# Patient Record
Sex: Female | Born: 1980 | Race: Black or African American | Hispanic: No | Smoking: Former smoker
Health system: Southern US, Community
[De-identification: ages and names within clinical notes are randomized; demographics above are authoritative.]

## PROBLEM LIST (undated history)

## (undated) DIAGNOSIS — R519 Headache, unspecified: Secondary | ICD-10-CM

## (undated) DIAGNOSIS — R51 Headache: Secondary | ICD-10-CM

## (undated) DIAGNOSIS — E119 Type 2 diabetes mellitus without complications: Secondary | ICD-10-CM

## (undated) DIAGNOSIS — R059 Cough, unspecified: Secondary | ICD-10-CM

## (undated) DIAGNOSIS — I10 Essential (primary) hypertension: Secondary | ICD-10-CM

## (undated) DIAGNOSIS — K219 Gastro-esophageal reflux disease without esophagitis: Secondary | ICD-10-CM

## (undated) DIAGNOSIS — F419 Anxiety disorder, unspecified: Secondary | ICD-10-CM

## (undated) DIAGNOSIS — J309 Allergic rhinitis, unspecified: Secondary | ICD-10-CM

## (undated) DIAGNOSIS — R05 Cough: Secondary | ICD-10-CM

## (undated) DIAGNOSIS — R109 Unspecified abdominal pain: Secondary | ICD-10-CM

## (undated) HISTORY — DX: Type 2 diabetes mellitus without complications: E11.9

## (undated) HISTORY — DX: Unspecified abdominal pain: R10.9

## (undated) HISTORY — DX: Cough, unspecified: R05.9

## (undated) HISTORY — DX: Anxiety disorder, unspecified: F41.9

## (undated) HISTORY — DX: Allergic rhinitis, unspecified: J30.9

## (undated) HISTORY — DX: Cough: R05

---

## 2009-03-20 DIAGNOSIS — R5383 Other fatigue: Secondary | ICD-10-CM | POA: Insufficient documentation

## 2009-03-20 DIAGNOSIS — R5381 Other malaise: Secondary | ICD-10-CM | POA: Insufficient documentation

## 2009-03-20 DIAGNOSIS — E669 Obesity, unspecified: Secondary | ICD-10-CM | POA: Insufficient documentation

## 2009-05-14 DIAGNOSIS — F0630 Mood disorder due to known physiological condition, unspecified: Secondary | ICD-10-CM | POA: Insufficient documentation

## 2011-08-23 HISTORY — PX: CHOLECYSTECTOMY: SHX55

## 2011-09-08 DIAGNOSIS — K8020 Calculus of gallbladder without cholecystitis without obstruction: Secondary | ICD-10-CM | POA: Insufficient documentation

## 2011-09-08 DIAGNOSIS — R252 Cramp and spasm: Secondary | ICD-10-CM | POA: Insufficient documentation

## 2011-09-09 DIAGNOSIS — R1011 Right upper quadrant pain: Secondary | ICD-10-CM | POA: Insufficient documentation

## 2011-09-09 DIAGNOSIS — E559 Vitamin D deficiency, unspecified: Secondary | ICD-10-CM | POA: Insufficient documentation

## 2011-09-12 DIAGNOSIS — N83202 Unspecified ovarian cyst, left side: Secondary | ICD-10-CM | POA: Insufficient documentation

## 2011-09-12 DIAGNOSIS — D259 Leiomyoma of uterus, unspecified: Secondary | ICD-10-CM | POA: Insufficient documentation

## 2011-10-11 DIAGNOSIS — F32A Depression, unspecified: Secondary | ICD-10-CM | POA: Insufficient documentation

## 2011-12-14 ENCOUNTER — Emergency Department (HOSPITAL_COMMUNITY)
Admission: EM | Admit: 2011-12-14 | Discharge: 2011-12-14 | Disposition: A | Discharge status: Home / Self Care | Payer: 59 | Attending: Emergency Medicine | Admitting: Emergency Medicine

## 2011-12-14 ENCOUNTER — Encounter (HOSPITAL_COMMUNITY): Payer: Self-pay

## 2011-12-14 ENCOUNTER — Emergency Department (HOSPITAL_COMMUNITY): Payer: 59

## 2011-12-14 DIAGNOSIS — R109 Unspecified abdominal pain: Secondary | ICD-10-CM | POA: Insufficient documentation

## 2011-12-14 DIAGNOSIS — R110 Nausea: Secondary | ICD-10-CM | POA: Insufficient documentation

## 2011-12-14 DIAGNOSIS — K8020 Calculus of gallbladder without cholecystitis without obstruction: Secondary | ICD-10-CM | POA: Insufficient documentation

## 2011-12-14 LAB — COMPREHENSIVE METABOLIC PANEL
ALT: 16 U/L (ref 0–35)
AST: 21 U/L (ref 0–37)
Albumin: 3.2 g/dL — ABNORMAL LOW (ref 3.5–5.2)
Alkaline Phosphatase: 52 U/L (ref 39–117)
CO2: 21 mEq/L (ref 19–32)
Chloride: 106 mEq/L (ref 96–112)
GFR calc non Af Amer: >90 mL/min (ref >90)
Potassium: 4 mEq/L (ref 3.5–5.1)
Sodium: 138 mEq/L (ref 135–145)
Total Bilirubin: 0.2 mg/dL — ABNORMAL LOW (ref 0.3–1.2)

## 2011-12-14 LAB — POCT PREGNANCY, URINE: Preg Test, Ur: NEGATIVE

## 2011-12-14 LAB — URINALYSIS, ROUTINE W REFLEX MICROSCOPIC
Glucose, UA: NEGATIVE mg/dL
Ketones, ur: NEGATIVE mg/dL
Leukocytes, UA: NEGATIVE
Protein, ur: NEGATIVE mg/dL
pH: 6.5 (ref 5.0–8.0)

## 2011-12-14 LAB — CBC
HCT: 36.9 % (ref 36.0–46.0)
MCHC: 34.4 g/dL (ref 30.0–36.0)
Platelets: 337 10*3/uL (ref 150–400)
RDW: 13 % (ref 11.5–15.5)
WBC: 9.2 10*3/uL (ref 4.0–10.5)

## 2011-12-14 LAB — URINE MICROSCOPIC-ADD ON

## 2011-12-14 LAB — DIFFERENTIAL
Basophils Absolute: 0 10*3/uL (ref 0.0–0.1)
Basophils Relative: 0 % (ref 0–1)
Lymphocytes Relative: 20 % (ref 12–46)
Monocytes Absolute: 0.4 10*3/uL (ref 0.1–1.0)
Neutro Abs: 6.8 10*3/uL (ref 1.7–7.7)
Neutrophils Relative %: 74 % (ref 43–77)

## 2011-12-14 MED ORDER — OXYCODONE-ACETAMINOPHEN 5-325 MG PO TABS: 1.0000 | ORAL_TABLET | Freq: Four times a day (QID) | ORAL | Status: AC | PRN

## 2011-12-14 MED ORDER — OXYCODONE-ACETAMINOPHEN 5-325 MG PO TABS
1.0000 | ORAL_TABLET | Freq: Once | ORAL | Status: AC
  Administered 2011-12-14: 1 via ORAL
  Filled 2011-12-14: qty 1

## 2011-12-14 MED ORDER — KETOROLAC TROMETHAMINE 60 MG/2ML IM SOLN
60.0000 mg | Freq: Once | INTRAMUSCULAR | Status: AC
  Administered 2011-12-14: 60 mg via INTRAMUSCULAR
  Filled 2011-12-14: qty 2

## 2011-12-14 NOTE — ED Notes (Signed)
Per EMS:  Pt reports RUQ abd pain. Pt was diagnosed with gallstones in Oct BP 147/110 P: 118 R: 20 SPO2: 98

## 2011-12-14 NOTE — ED Provider Notes (Signed)
History     CSN: [id number]  Arrival date & time 12/14/11  1478   First MD Initiated Contact with Patient 12/14/11 1010      Chief Complaint  Patient presents with  . Abdominal Pain    (Consider location/radiation/quality/duration/timing/severity/associated sxs/prior treatment) HPI Comments: Patient with a significant medical history presents emergency department with a chief complaint of abdominal pain.  Onset of symptoms began about 2 days ago, is described as intermittent and gradually worsening becoming severe today.  Location of pain is in the right upper quadrant and epigastric area described as sharp and knifelike stabbing.  Pain radiates to the right scapula and lasts about a minute and 30 seconds long.  Severity at its worst is 10 out of 10 and currently the patient states that 7/10  Pain is occasionally associated with food. Associated symptoms include nausea but the patient denies any vomiting, diarrhea, shortness of breath, dyspnea on exertion, chest pain, cough, hemoptysis, recent travel or claudication.  Note the patient is in everyday smoker and is currently taking birth control. Pt states she was dx with gallstones in the October.   Patient is a 31 y.o. female presenting with abdominal pain. The history is provided by the patient.  Abdominal Pain The primary symptoms of the illness include abdominal pain and nausea. The primary symptoms of the illness do not include vomiting or diarrhea.  Symptoms associated with the illness do not include constipation.    No past medical history on file.  No past surgical history on file.  No family history on file.  History  Substance Use Topics  . Smoking status: Not on file  . Smokeless tobacco: Not on file  . Alcohol Use: Not on file    OB History    No data available      Review of Systems  Gastrointestinal: Positive for nausea and abdominal pain. Negative for vomiting, diarrhea, constipation, blood in stool, abdominal  distention, anal bleeding and rectal pain.  All other systems reviewed and are negative.    Allergies  Review of patient's allergies indicates no known allergies.  Home Medications   Current Outpatient Rx  Name Route Sig Dispense Refill  . ACETAMINOPHEN-CODEINE #3 300-30 MG PO TABS Oral Take 1-2 tablets by mouth daily as needed. For pain.    . ASPIRIN-ACETAMINOPHEN-CAFFEINE 250-250-65 MG PO TABS Oral Take 2 tablets by mouth daily as needed. For headache.    . CETIRIZINE HCL 10 MG PO TABS Oral Take 10 mg by mouth daily as needed. For allergies.    Marland Kitchen CITALOPRAM HYDROBROMIDE 10 MG PO TABS Oral Take 10 mg by mouth daily.    . CYCLOBENZAPRINE HCL 10 MG PO TABS Oral Take 10 mg by mouth every 8 (eight) hours.    Carma Leaven M PLUS PO TABS Oral Take 1 tablet by mouth daily.    Azzie Roup ACE-ETH ESTRAD-FE 1.5-30 MG-MCG PO TABS Oral Take 1 tablet by mouth daily.      BP 125/84  Pulse 86  Temp 98.7 F (37.1 C)  Resp 20  SpO2 100%  Physical Exam  Nursing note and vitals reviewed. Constitutional: Vital signs are normal. She appears well-developed and well-nourished. No distress.  HENT:  Head: Normocephalic and atraumatic.  Mouth/Throat: Uvula is midline, oropharynx is clear and moist and mucous membranes are normal.  Eyes: Conjunctivae and EOM are normal. Pupils are equal, round, and reactive to light.  Neck: Normal range of motion and full passive range of motion without pain. Neck  supple. No spinous process tenderness and no muscular tenderness present. No rigidity. No Brudzinski's sign noted.  Cardiovascular: Normal rate and regular rhythm.   Pulmonary/Chest: Effort normal and breath sounds normal. No accessory muscle usage. Not tachypneic. No respiratory distress.  Abdominal: Soft. Normal appearance. She exhibits no distension, no ascites, no pulsatile midline mass and no mass. There is tenderness. There is no CVA tenderness. No hernia.    Lymphadenopathy:    She has no cervical  adenopathy.  Neurological: She is alert.  Skin: Skin is warm and dry. No rash noted. She is not diaphoretic.  Psychiatric: She has a normal mood and affect. Her speech is normal and behavior is normal.    ED Course  Procedures (including critical care time)  Labs Reviewed  COMPREHENSIVE METABOLIC PANEL - Abnormal; Notable for the following:    Albumin 3.2 (*)    Total Bilirubin 0.2 (*)    All other components within normal limits  URINALYSIS, ROUTINE W REFLEX MICROSCOPIC - Abnormal; Notable for the following:    Hgb urine dipstick TRACE (*)    All other components within normal limits  URINE MICROSCOPIC-ADD ON - Abnormal; Notable for the following:    Squamous Epithelial / LPF MANY (*)    All other components within normal limits  CBC  DIFFERENTIAL  LIPASE, BLOOD  POCT PREGNANCY, URINE   US Abdomen Complete  12/14/2011  *RADIOLOGY REPORT*  Clinical Data: Right upper quadrant pain.  ABDOMEN ULTRASOUND  Technique:  Complete abdominal ultrasound examination was performed including evaluation of the liver, gallbladder, bile ducts, pancreas, kidneys, spleen, IVC, and abdominal aorta.  Comparison: No comparison studies available.  Findings:  Gallbladder:  Multiple stones are seen in the gallbladder lumen. Stones measure up to about 2 cm in diameter.  There is no gallbladder wall thickening or pericholecystic fluid.  The sonographer reports no sonographic Murphy's sign.  Common Bile Duct:  Nondilated at 4 mm diameter.  Liver:  Coarsening of the echotexture suggest fatty infiltration. No intrahepatic biliary duct dilatation.  No evidence for intraparenchymal mass lesion.  IVC:  Normal.  Pancreas:  Normal.  Spleen:  Normal.  Right kidney:  11.8 cm in long axis.  Normal.  Left kidney:  11.0 cm in long axis.  Normal.  Abdominal Aorta:  No aneurysm.  IMPRESSION: Cholelithiasis without gallbladder wall thickening, ductal dilatation, or pericholecystic fluid.  Original Report Authenticated By: ERIC A.  MANSELL, M.D.     No diagnosis found.    MDM  Cholelithiasis  Patient's pain is in his in emergency department.  She has been hemodynamically stable and in no acute distress throughout the entire hospital stay.  Findings of the ultrasound indicated the patient has cholecystitis this without any gallbladder thickening, duct dilatation, or pericholecystic fluid.  Labs have been reviewed and lipase and WBC are within normal limits.  Patient has been afebrile with vital signs stable.  Patient has been instructed to followup with Gen. surgery for further workup.  Patient will be discharged with pain medication.        Jaci Carrel, [place] 12/14/11 1210

## 2011-12-14 NOTE — Discharge Instructions (Signed)
Cholelithiasis Cholelithiasis (also called gallstones) is a form of gallbladder disease where gallstones form in your gallbladder. The gallbladder is a non-essential organ that stores bile made in the liver, which helps digest fats. Gallstones begin as small crystals and slowly grow into stones. Gallstone pain occurs when the gallbladder spasms, and a gallstone is blocking the duct. Pain can also occur when a stone passes out of the duct.  Women are more likely to develop gallstones than men. Other factors that increase the risk of gallbladder disease are:  Having multiple pregnancies. Physicians sometimes advise removing diseased gallbladders before future pregnancies.   Obesity.   Diets heavy in fried foods and fat.   Increasing age (older than 60).   Prolonged use of medications containing female hormones.   Diabetes mellitus.   Rapid weight loss.   Family history of gallstones (heredity).  SYMPTOMS  Feeling sick to your stomach (nauseous).   Abdominal pain.   Yellowing of the skin (jaundice).   Sudden pain. It may persist from several minutes to several hours.   Worsening pain with deep breathing or when jarred.   Fever.   Tenderness to the touch.  In some cases, when gallstones do not move into the bile duct, people have no pain or symptoms. These are called "silent" gallstones. TREATMENT In severe cases, emergency surgery may be required. HOME CARE INSTRUCTIONS   Only take over-the-counter or prescription medicines for pain, discomfort, or fever as directed by your caregiver.   Follow a low-fat diet until seen again. Fat causes the gallbladder to contract, which can result in pain.   Follow up as instructed. Attacks are almost always recurrent and surgery is usually required for permanent treatment.  SEEK IMMEDIATE MEDICAL CARE IF:   Your pain increases and is not controlled by medications.   You have an oral temperature above 102 F (38.9 C), not controlled by  medication.   You develop nausea and vomiting.  MAKE SURE YOU:   Understand these instructions.   Will watch your condition.   Will get help right away if you are not doing well or get worse.  Document Released: 08/04/2005 Document Revised: 07/28/2011 Document Reviewed: 10/07/2010 [employer] Patient Information 2012 [employer]. 

## 2011-12-17 NOTE — ED Provider Notes (Signed)
Medical screening examination/treatment/procedure(s) were performed by non-physician practitioner and as supervising physician I was immediately available for consultation/collaboration.  Carmelita Amparo R. Iasia Forcier, MD 12/17/11 0737 

## 2012-01-05 ENCOUNTER — Ambulatory Visit (INDEPENDENT_AMBULATORY_CARE_PROVIDER_SITE_OTHER): Payer: 59 | Admitting: Surgery

## 2013-04-03 ENCOUNTER — Ambulatory Visit (INDEPENDENT_AMBULATORY_CARE_PROVIDER_SITE_OTHER): Payer: 59 | Admitting: Internal Medicine

## 2013-04-03 ENCOUNTER — Encounter: Payer: Self-pay | Admitting: Internal Medicine

## 2013-04-03 VITALS — BP 136/88 | HR 82 | Temp 98.4°F | Resp 14 | Ht 65.5 in | Wt 232.0 lb

## 2013-04-03 DIAGNOSIS — F329 Major depressive disorder, single episode, unspecified: Secondary | ICD-10-CM

## 2013-04-03 DIAGNOSIS — G43909 Migraine, unspecified, not intractable, without status migrainosus: Secondary | ICD-10-CM | POA: Insufficient documentation

## 2013-04-03 DIAGNOSIS — G4700 Insomnia, unspecified: Secondary | ICD-10-CM | POA: Insufficient documentation

## 2013-04-03 DIAGNOSIS — F32A Depression, unspecified: Secondary | ICD-10-CM | POA: Insufficient documentation

## 2013-04-03 DIAGNOSIS — J309 Allergic rhinitis, unspecified: Secondary | ICD-10-CM

## 2013-04-03 MED ORDER — VILAZODONE HCL 10 & 20 & 40 MG PO KIT: 1.0000 | PACK | Freq: Every day | ORAL | Status: DC

## 2013-04-03 NOTE — Progress Notes (Signed)
Patient ID: Kendra Ruiz, female   DOB: [birth year], 32 y.o.   MRN: [id number]  Chief Complaint  Patient presents with  . NP to Establish    No Known Allergies  HPI 32 y/o female patient is here to establish care. She feels low and that is the main reason she made this appointment. She is under lot of stress with her husband and his legal issues. She is worried about him getting into trouble and then she will be left on her own to take care of herself and their son. She has been thinking about this all time. This is preventing her from focusing on her work. She is not able to sleep at night. She gets tearful easily and is not able to control her emotions. She finds herself being mean to her staff at work. She wants to hold things together but feels it is all falling apart  Review of Systems  Constitutional: Negative for fever, chills, malaise/fatigue and diaphoresis.  HENT: Negative for sore throat and neck pain.   Eyes: Negative for blurred vision and double vision.  Respiratory: Negative for cough, hemoptysis and shortness of breath.   Cardiovascular: Negative for chest pain, palpitations, orthopnea, claudication, leg swelling and PND.  Gastrointestinal: Negative for heartburn, nausea, vomiting, abdominal pain, diarrhea and constipation.  Genitourinary: Negative for dysuria.  Musculoskeletal: Negative for myalgias and falls.  Skin: Negative for itching and rash.  Neurological: Negative for dizziness, tingling, focal weakness, weakness and headaches.  Psychiatric/Behavioral: Positive for depression. Negative for suicidal ideas, hallucinations, memory loss and substance abuse. The patient is nervous/anxious and has insomnia.    Past Medical History  Diagnosis Date  . Allergic rhinitis   . Abdominal cramping   . Anxiety   . Cough    Past Surgical History  Procedure Laterality Date  . Cholecystectomy  2013   Family History  Problem Relation Age of Onset  . Diabetes Sister   .  Hypertension Sister   . Bipolar disorder Sister   . Bipolar disorder Sister   . Obesity Sister    History   Social History  . Marital Status: Married    Spouse Name: N/A    Number of Children: N/A  . Years of Education: N/A   Occupational History  . Not on file.   Social History Main Topics  . Smoking status: Current Every Day Smoker -- 0.50 packs/day for 10 years    Types: Cigarettes  . Smokeless tobacco: Not on file  . Alcohol Use: Yes  . Drug Use: No  . Sexual Activity: Not on file   Other Topics Concern  . Not on file   Social History Narrative  . No narrative on file   BP 136/88  Pulse 82  Temp(Src) 98.4 F (36.9 C) (Oral)  Resp 14  Ht 5' 5.5" (1.664 m)  Wt 232 lb (105.235 kg)  BMI 38.01 kg/m2  LMP 04/03/2013  Physical Exam  Constitutional: She is oriented to person, place, and time. She appears well-developed and well-nourished. No distress.  HENT:  Head: Normocephalic and atraumatic.  Mouth/Throat: Oropharynx is clear and moist. No oropharyngeal exudate.  Eyes: Conjunctivae are normal. Pupils are equal, round, and reactive to light.  Neck: Normal range of motion. Neck supple. No JVD present. No thyromegaly present.  Cardiovascular: Normal rate and regular rhythm.   Pulmonary/Chest: Effort normal and breath sounds normal. No respiratory distress. She has no wheezes. She has no rales.  Abdominal: Soft. Bowel sounds are normal. She exhibits  no mass.  Musculoskeletal: Normal range of motion. She exhibits no edema and no tenderness.  Lymphadenopathy:    She has no cervical adenopathy.  Neurological: She is alert and oriented to person, place, and time. No cranial nerve deficit.  Skin: Skin is warm and dry. She is not diaphoretic.  Psychiatric: Judgment and thought content normal.  tearful   No labs for review  Assessment/plan  Depression- will have her on viibryd- will give titration kit of 10, 20 and 40 mg daily and see her in 4 weeks to assess the  effect of medication. Recommended psychologist visit for couselling  Insomnia- sleep hygiene reviewed with patient. Will continue restoril for now  Migraine- stable with excedrin  Allergic rhinitis- continue zyrtec for now

## 2013-05-01 ENCOUNTER — Ambulatory Visit: Payer: Self-pay | Admitting: Internal Medicine

## 2013-05-14 ENCOUNTER — Ambulatory Visit (INDEPENDENT_AMBULATORY_CARE_PROVIDER_SITE_OTHER): Payer: 59 | Admitting: Internal Medicine

## 2013-05-14 ENCOUNTER — Encounter: Payer: Self-pay | Admitting: Internal Medicine

## 2013-05-14 VITALS — BP 118/88 | HR 94 | Temp 98.6°F | Resp 18 | Ht 65.5 in | Wt 240.8 lb

## 2013-05-14 DIAGNOSIS — R7309 Other abnormal glucose: Secondary | ICD-10-CM

## 2013-05-14 DIAGNOSIS — F329 Major depressive disorder, single episode, unspecified: Secondary | ICD-10-CM

## 2013-05-14 DIAGNOSIS — M549 Dorsalgia, unspecified: Secondary | ICD-10-CM | POA: Insufficient documentation

## 2013-05-14 DIAGNOSIS — Z0000 Encounter for general adult medical examination without abnormal findings: Secondary | ICD-10-CM

## 2013-05-14 DIAGNOSIS — J309 Allergic rhinitis, unspecified: Secondary | ICD-10-CM

## 2013-05-14 DIAGNOSIS — G4700 Insomnia, unspecified: Secondary | ICD-10-CM

## 2013-05-14 DIAGNOSIS — R739 Hyperglycemia, unspecified: Secondary | ICD-10-CM | POA: Insufficient documentation

## 2013-05-14 DIAGNOSIS — Z30430 Encounter for insertion of intrauterine contraceptive device: Secondary | ICD-10-CM | POA: Insufficient documentation

## 2013-05-14 MED ORDER — NAPROXEN 500 MG PO TABS: 500.0000 mg | ORAL_TABLET | Freq: Three times a day (TID) | ORAL | Status: DC

## 2013-05-14 MED ORDER — ESCITALOPRAM OXALATE 10 MG PO TABS: 10.0000 mg | ORAL_TABLET | Freq: Every day | ORAL | Status: DC

## 2013-05-14 NOTE — Patient Instructions (Signed)
As discussed, keep track of your calorie count on My Fitness Pal and exercise 4 days a week.   Do not discontinue your medications by yourself without consulting the office  If your back pain does not imporve with current medication, please notify us

## 2013-05-14 NOTE — Progress Notes (Signed)
Patient ID: Kendra Ruiz, female   DOB: 1981/02/27, 32 y.o.   MRN: [id number]  Chief Complaint  Patient presents with  . Annual Exam    depression, insomnia   No Known Allergies  HPI 32 y/o female patient is here for her annual visit. She sees her Gyn for her routine pelvic and breast exam and has appointment on 05/16/13. She was started on viibryd last visit for her depression but noted herself to be "wired' from this and thus stopped taking it a week and a half back. This was also making sleepy and tired. Her husband is listening to her and thus she feels less stressful than before. She feels she is holding the situation better than before but still feels in a lose all situation She has tired celexa for a year before and was helping her. She will be getting her influenza vaccine at work She has been having heartburn for few months. She had cholecystectomy last year. While eating heavy meals, she has discomfort in her abdomen She eats out  and junk food. She had bojangles for breakfast, [employer] burger for lunch and burger king sandwich for dinner yesterday. Does not exercise She was pushing med cart at work few days back and developed soreness on right mid back area. This has resolved some but now has noticed pain on left mid back area, occurs intermittently, as sharp pain with some radiation down her leg and resolves by itself. She laso has soreness on her muscles on that side.   Review of Systems  Constitutional: Negative for fever, chills, malaise/fatigue and diaphoresis.  HENT: Negative for sore throat and neck pain.   Eyes: Negative for blurred vision and double vision.  Respiratory: Negative for cough, hemoptysis and shortness of breath.   Cardiovascular: Negative for chest pain, palpitations, orthopnea, claudication, leg swelling and PND.  Gastrointestinal: Negative for nausea, vomiting, abdominal pain, diarrhea and constipation.  Genitourinary: Negative for dysuria,  heamturia Musculoskeletal: Negative for myalgias and falls.  Skin: Negative for itching and rash.  Neurological: Negative for dizziness, tingling, focal weakness, weakness and headaches.  Psychiatric/Behavioral: Positive for depression. Negative for suicidal ideas, hallucinations, memory loss and substance abuse. The patient is nervous/anxious and has insomnia. she has been taking restoril and that is helpful  Past Medical History  Diagnosis Date  . Allergic rhinitis   . Abdominal cramping   . Anxiety   . Cough    Past Surgical History  Procedure Laterality Date  . Cholecystectomy  2013   Family History  Problem Relation Age of Onset  . Diabetes Sister   . Hypertension Sister   . Bipolar disorder Sister   . Bipolar disorder Sister   . Obesity Sister    History   Social History  . Marital Status: Married    Spouse Name: N/A    Number of Children: N/A  . Years of Education: N/A   Occupational History  . Not on file.   Social History Main Topics  . Smoking status: Current Every Day Smoker -- 0.50 packs/day for 10 years    Types: Cigarettes  . Smokeless tobacco: Not on file  . Alcohol Use: Yes  . Drug Use: No  . Sexual Activity: Not on file   Other Topics Concern  . Not on file   Social History Narrative  . No narrative on file    Current Outpatient Prescriptions on File Prior to Visit  Medication Sig Dispense Refill  . aspirin-acetaminophen-caffeine (EXCEDRIN MIGRAINE) 250-250-65 MG per tablet  Take 2 tablets by mouth daily as needed. For headache.      . cetirizine (ZYRTEC) 10 MG tablet Take 10 mg by mouth daily as needed. For allergies.      . Multiple Vitamins-Minerals (MULTIVITAMINS THER. W/MINERALS) TABS Take 1 tablet by mouth daily.      . norethindrone-ethinyl estradiol-iron (MICROGESTIN FE,GILDESS FE,LOESTRIN FE) 1.5-30 MG-MCG tablet Take 1 tablet by mouth daily.      . temazepam (RESTORIL) 7.5 MG capsule Take 7.5 mg by mouth at bedtime as needed for  sleep.       No current facility-administered medications on file prior to visit.   Physical exam  BP 118/88  Pulse 94  Temp(Src) 98.6 F (37 C) (Oral)  Resp 18  Ht 5' 5.5" (1.664 m)  Wt 240 lb 12.8 oz (109.226 kg)  BMI 39.45 kg/m2  SpO2 98%  Constitutional: She is oriented to person, place, and time. She appears well-developed and well-nourished. No distress.  HENT:   Head: Normocephalic and atraumatic.   Mouth/Throat: Oropharynx is clear and moist. No oropharyngeal exudate.  Eyes: Conjunctivae are normal. Pupils are equal, round, and reactive to light.  Neck: Normal range of motion. Neck supple. No JVD present. No thyromegaly present.  Cardiovascular: Normal rate and regular rhythm.   Pulmonary/Chest: Effort normal and breath sounds normal. No respiratory distress. She has no wheezes. She has no rales.  Abdominal: Soft. Bowel sounds are normal. She exhibits no mass.  Musculoskeletal: Normal range of motion. She exhibits no edema and no tenderness.  Lymphadenopathy:    She has no cervical adenopathy.  Neurological: She is alert and oriented to person, place, and time. No cranial nerve deficit.  Skin: Skin is warm and dry. She is not diaphoretic.  Psychiatric: Judgment and thought content normal.  Gets pelvic and breast exam at her Gyn   No labs for review  Assessment/plan  Depression- persists. Self stopped viibryd. Her lifestyle and current home situation are contributing to this mainly. Will have her on escitalopram 10 mg daily for 2 weeks and then increase to 20 mg daily and reassess her in 6 weeks. counselled about warning signs requiring ED visit. Encouraged not to stop medication by herself. Common side effects explained. Also encouraged to exercise and be careful with her diet.talked about different apps she can use on her phone to keep track of her calorie intake  Insomnia- sleep hygiene reviewed with patient. Will continue restoril for now  Allergic rhinitis-  continue zyrtec for now. Symptoms under control  Routine exam- will get her influenza vaccine at work. Has upcoming ob/gyn appointment. Exercise and dietary counselling provided.  Hyperglycemia- hx of hyperglycemia in past. No labs for review. Will check bmp and a1c to rule out diabetes  Back pain- appears musculoskeletal. Avoid heavy lifting/ pushing for now, back rest and take naprsyn 500 mg every 8 hours for 5 days. If no improvement, will consider further imaging study  Spent more than 25 minutes of this 50 minutes visit counselling her about lifestyle modification, losing weight

## 2013-05-15 LAB — COMPREHENSIVE METABOLIC PANEL
ALT: 26 IU/L (ref 0–32)
AST: 27 IU/L (ref 0–40)
Albumin/Globulin Ratio: 1.4 (ref 1.1–2.5)
Chloride: 101 mmol/L (ref 97–108)
GFR calc Af Amer: 139 mL/min/{1.73_m2} (ref >59)
GFR calc non Af Amer: 120 mL/min/{1.73_m2} (ref >59)
Glucose: 90 mg/dL (ref 65–99)
Potassium: 4.5 mmol/L (ref 3.5–5.2)
Sodium: 137 mmol/L (ref 134–144)
Total Bilirubin: 0.2 mg/dL (ref 0.0–1.2)
Total Protein: 6.5 g/dL (ref 6.0–8.5)

## 2013-05-15 LAB — CBC WITH DIFFERENTIAL/PLATELET
Basophils Absolute: 0 10*3/uL (ref 0.0–0.2)
Eos: 1 %
Immature Grans (Abs): 0 10*3/uL (ref 0.0–0.1)
Immature Granulocytes: 0 %
Lymphs: 24 %
MCHC: 32.9 g/dL (ref 31.5–35.7)
Monocytes: 6 %
Neutrophils Relative %: 69 %
RDW: 13.4 % (ref 12.3–15.4)
WBC: 9.8 10*3/uL (ref 3.4–10.8)

## 2013-05-15 LAB — LIPID PANEL
Cholesterol, Total: 147 mg/dL (ref 100–199)
LDL Calculated: 65 mg/dL (ref 0–99)
Triglycerides: 112 mg/dL (ref 0–149)

## 2013-05-15 LAB — HEMOGLOBIN A1C: Est. average glucose Bld gHb Est-mCnc: 137 mg/dL

## 2013-05-15 LAB — TSH: TSH: 1.62 u[IU]/mL (ref 0.450–4.500)

## 2013-07-24 ENCOUNTER — Ambulatory Visit: Payer: 59 | Admitting: Internal Medicine

## 2013-07-24 DIAGNOSIS — Z0289 Encounter for other administrative examinations: Secondary | ICD-10-CM

## 2013-09-24 ENCOUNTER — Emergency Department (INDEPENDENT_AMBULATORY_CARE_PROVIDER_SITE_OTHER)
Admission: EM | Admit: 2013-09-24 | Discharge: 2013-09-24 | Disposition: A | Discharge status: Home / Self Care | Payer: 59 | Source: Home / Self Care

## 2013-09-24 ENCOUNTER — Encounter (HOSPITAL_COMMUNITY): Payer: Self-pay | Admitting: Emergency Medicine

## 2013-09-24 DIAGNOSIS — J069 Acute upper respiratory infection, unspecified: Secondary | ICD-10-CM

## 2013-09-24 NOTE — ED Provider Notes (Signed)
CSN: [id number]     Arrival date & time 09/24/13  1301 History   First MD Initiated Contact with Patient 09/24/13 1400     Chief Complaint  Patient presents with  . URI   (Consider location/radiation/quality/duration/timing/severity/associated sxs/prior Treatment) HPI Comments: Complains of upper respiratory infection symptoms.  Patient is a [age] y.o. female presenting with URI.  URI Presenting symptoms: congestion, cough, fatigue and rhinorrhea   Presenting symptoms: no fever   Associated symptoms: sneezing   Associated symptoms: no neck pain and no wheezing     Past Medical History  Diagnosis Date  . Allergic rhinitis   . Abdominal cramping   . Anxiety   . Cough    Past Surgical History  Procedure Laterality Date  . Cholecystectomy  2013   Family History  Problem Relation Age of Onset  . Diabetes Sister   . Hypertension Sister   . Bipolar disorder Sister   . Bipolar disorder Sister   . Obesity Sister    History  Substance Use Topics  . Smoking status: Current Every Day Smoker -- 0.50 packs/day for 10 years    Types: Cigarettes  . Smokeless tobacco: Not on file  . Alcohol Use: Yes   OB History   Grav Para Term Preterm Abortions TAB SAB Ect Mult Living                 Review of Systems  Constitutional: Positive for activity change and fatigue. Negative for fever, chills and appetite change.  HENT: Positive for congestion, postnasal drip, rhinorrhea and sneezing. Negative for facial swelling.   Eyes: Negative.   Respiratory: Positive for cough. Negative for shortness of breath and wheezing.   Cardiovascular: Negative.   Genitourinary: Negative.   Musculoskeletal: Negative for neck pain and neck stiffness.  Skin: Negative for pallor and rash.  Neurological: Negative.     Allergies  Review of patient's allergies indicates no known allergies.  Home Medications   Current Outpatient Rx  Name  Route  Sig  Dispense  Refill  . aspirin-acetaminophen-caffeine  (EXCEDRIN MIGRAINE) 250-250-65 MG per tablet   Oral   Take 2 tablets by mouth daily as needed. For headache.         . cetirizine (ZYRTEC) 10 MG tablet   Oral   Take 10 mg by mouth daily as needed. For allergies.         Marland Kitchen. escitalopram (LEXAPRO) 10 MG tablet   Oral   Take 1 tablet (10 mg total) by mouth daily. Take 10 mg daily for 2 weeks and then increase it to 20 mg daily   60 tablet   3   . Multiple Vitamins-Minerals (MULTIVITAMINS THER. W/MINERALS) TABS   Oral   Take 1 tablet by mouth daily.         . naproxen (NAPROSYN) 500 MG tablet   Oral   Take 1 tablet (500 mg total) by mouth 3 (three) times daily with meals.   30 tablet   0   . norethindrone-ethinyl estradiol-iron (MICROGESTIN FE,GILDESS FE,LOESTRIN FE) 1.5-30 MG-MCG tablet   Oral   Take 1 tablet by mouth daily.         . temazepam (RESTORIL) 7.5 MG capsule   Oral   Take 7.5 mg by mouth at bedtime as needed for sleep.          BP 118/91  Pulse 82  Temp(Src) 98.4 F (36.9 C) (Oral)  Resp 20  SpO2 100%  LMP 09/04/2013 Physical Exam  Nursing  note and vitals reviewed. Constitutional: She is oriented to person, place, and time. She appears well-developed and well-nourished. No distress.  HENT:  Mouth/Throat: No oropharyngeal exudate.  Bilateral TMs are normal Oropharynx is normal with the exception of clear PND.  Eyes: Conjunctivae and EOM are normal.  Neck: Normal range of motion. Neck supple.  Cardiovascular: Normal rate and regular rhythm.   Pulmonary/Chest: Effort normal and breath sounds normal. No respiratory distress. She has no wheezes. She has no rales.  Musculoskeletal: Normal range of motion. She exhibits no edema.  Lymphadenopathy:    She has no cervical adenopathy.  Neurological: She is alert and oriented to person, place, and time.  Skin: Skin is warm and dry. No rash noted.  Psychiatric: She has a normal mood and affect.    ED Course  Procedures (including critical care  time) Labs Review Labs Reviewed - No data to display Imaging Review No results found.    MDM   1. URI (upper respiratory infection)      Alka-Seltzer cold plus nighttime Robitussin-DM Rest and drink plenty of fluids    [name], NP 09/24/13 1444

## 2013-09-24 NOTE — Discharge Instructions (Signed)

## 2013-09-24 NOTE — ED Notes (Signed)
C/o  Productive cough with tan/grey sputum.  Sob.  Chest soreness.  Fever yesterday.    Pt has tried otc meds with no relief.  Denies n/v/d

## 2013-09-27 NOTE — ED Provider Notes (Signed)
Medical screening examination/treatment/procedure(s) were performed by resident physician or non-physician practitioner and as supervising physician I was immediately available for consultation/collaboration.   Jobanny Mavis DOUGLAS MD.   Emelda Kohlbeck D Dravon Nott, MD 09/27/13 1100 

## 2013-11-05 ENCOUNTER — Ambulatory Visit: Payer: 59 | Admitting: Internal Medicine

## 2013-11-05 DIAGNOSIS — Z0289 Encounter for other administrative examinations: Secondary | ICD-10-CM

## 2013-11-19 IMAGING — US US ABDOMEN COMPLETE
1 series · 14 of 25 positions shown · non-contrast
Comparison: No comparison studies available.

CLINICAL DATA: Right upper quadrant pain.

ABDOMEN ULTRASOUND
TECHNIQUE: Complete abdominal ultrasound examination was performed
including evaluation of the liver, gallbladder, bile ducts,
pancreas, kidneys, spleen, IVC, and abdominal aorta.

[Series 1: us abdomen complete · 0.30mm/px · 14 of 75 slices shown]
[im 1/75]
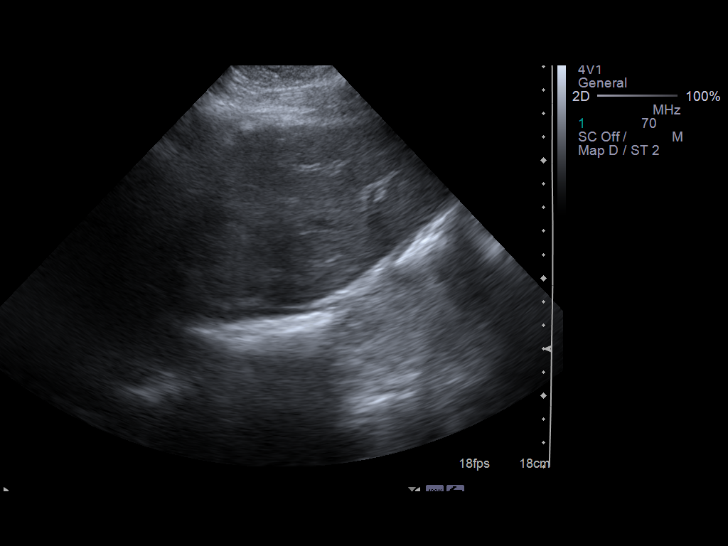
[im 7/75]
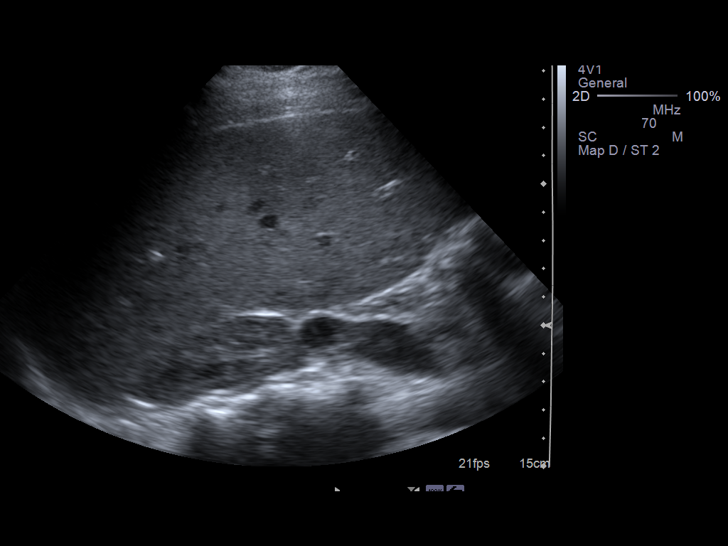
[im 13/75]
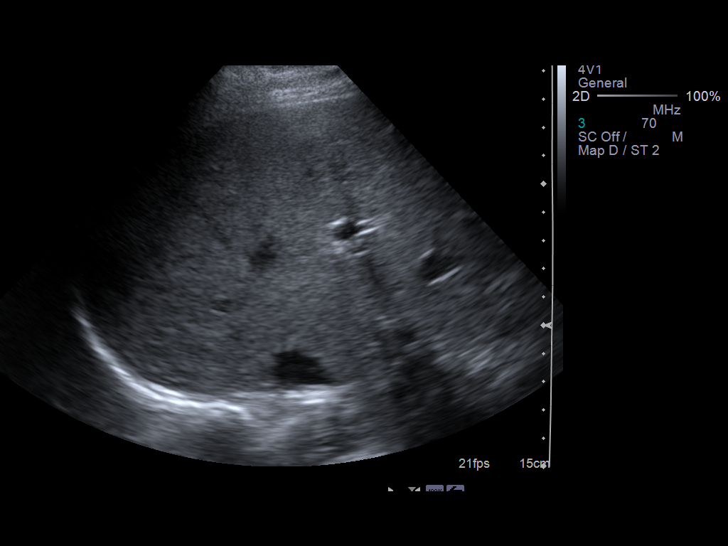
[im 19/75]
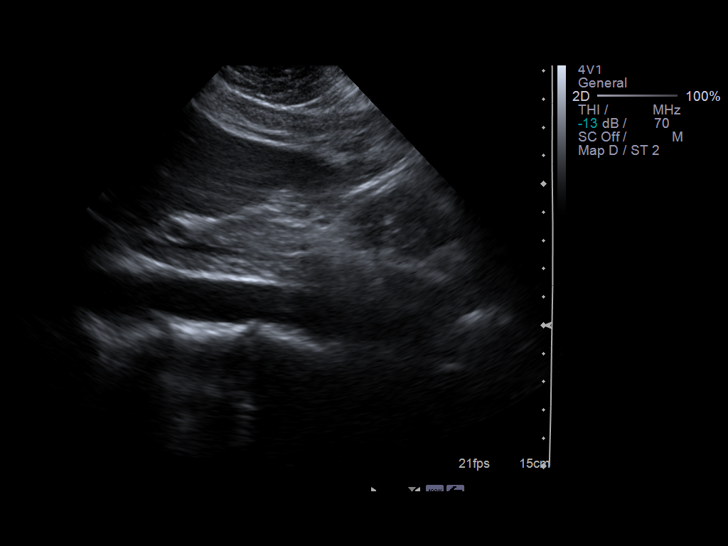
[im 25/75]
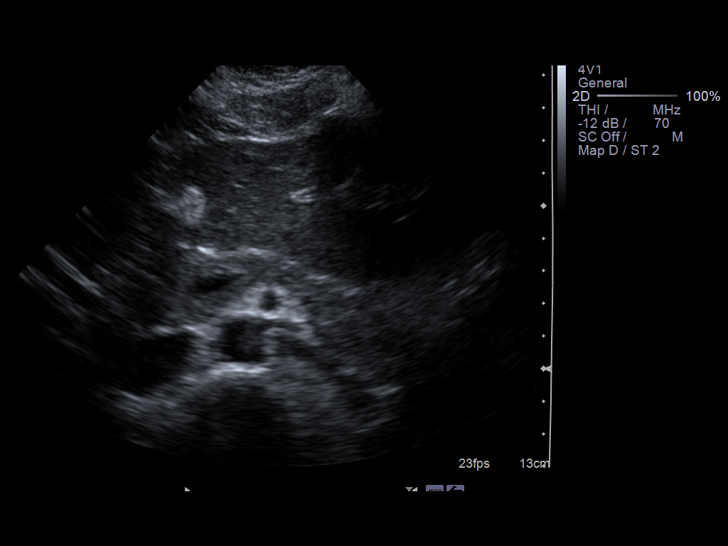
[im 28/75]
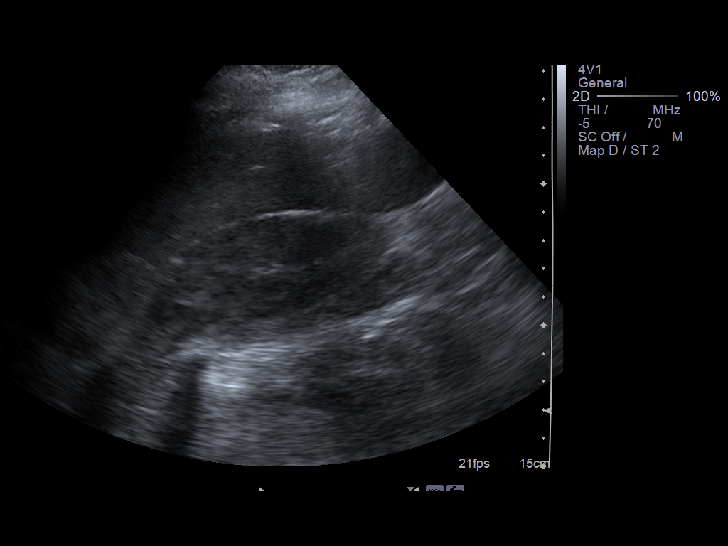
[im 34/75]
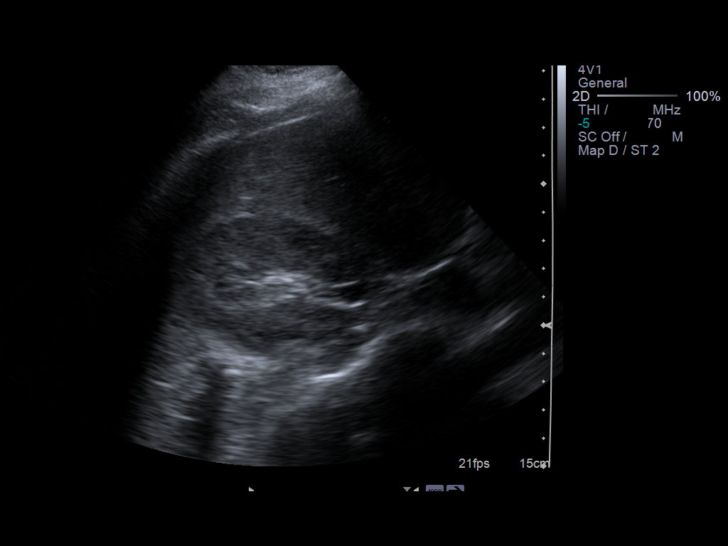
[im 41/75]
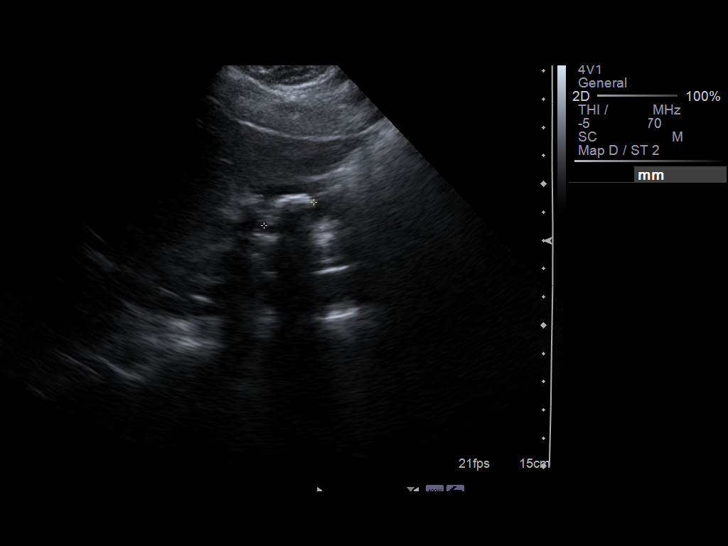
[im 47/75]
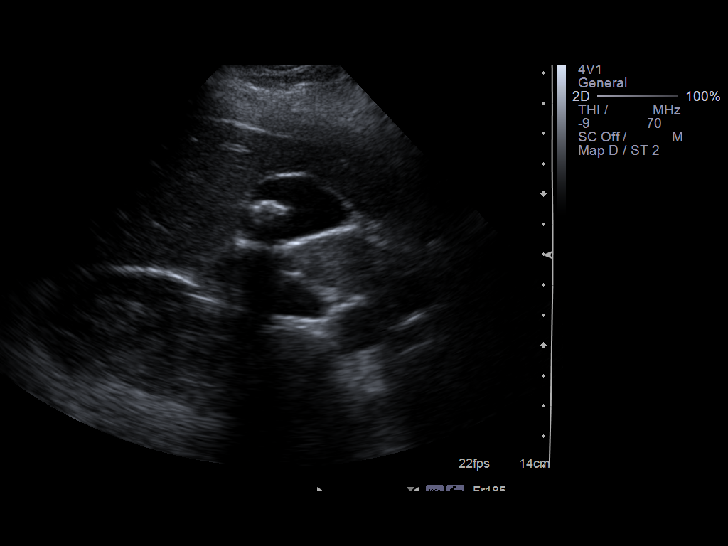
[im 50/75]
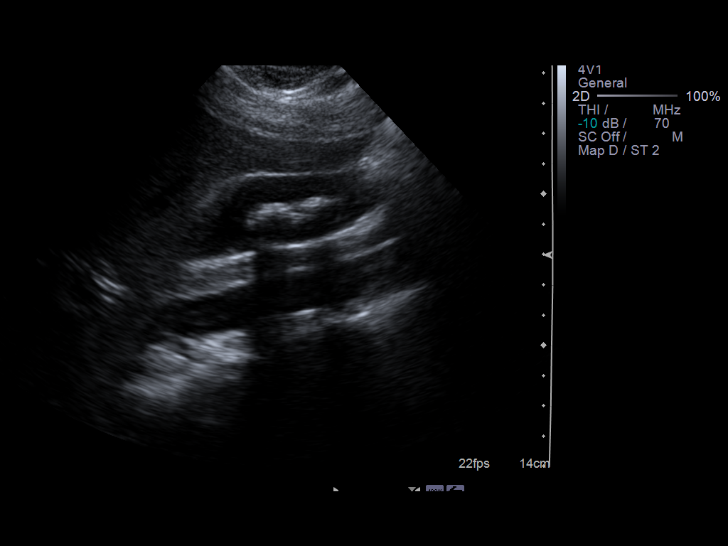
[im 56/75]
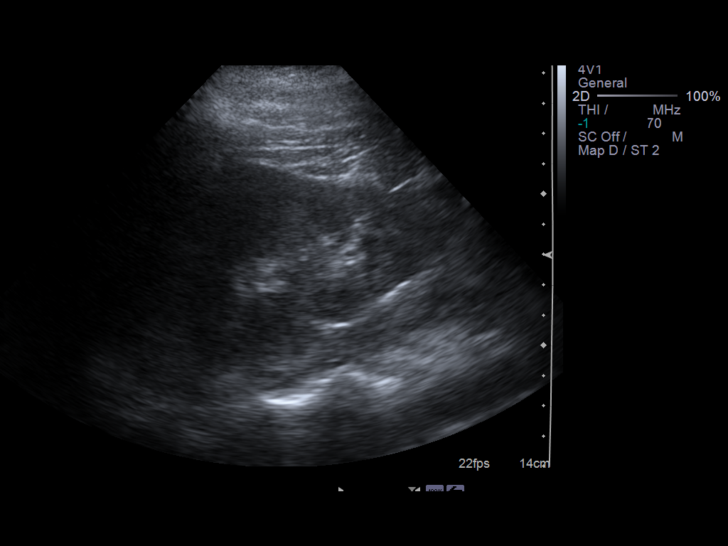
[im 62/75]
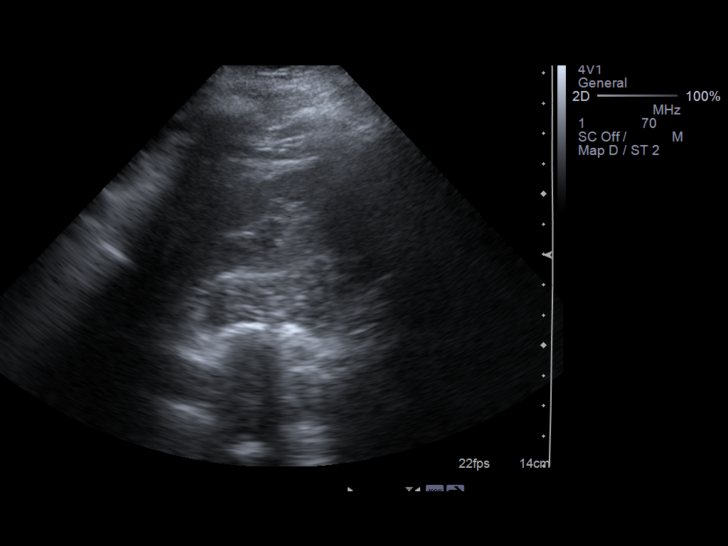
[im 68/75]
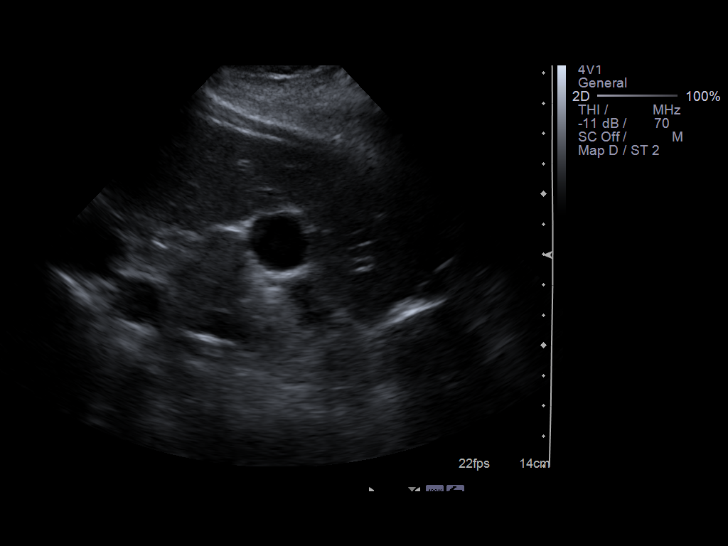
[im 75/75]
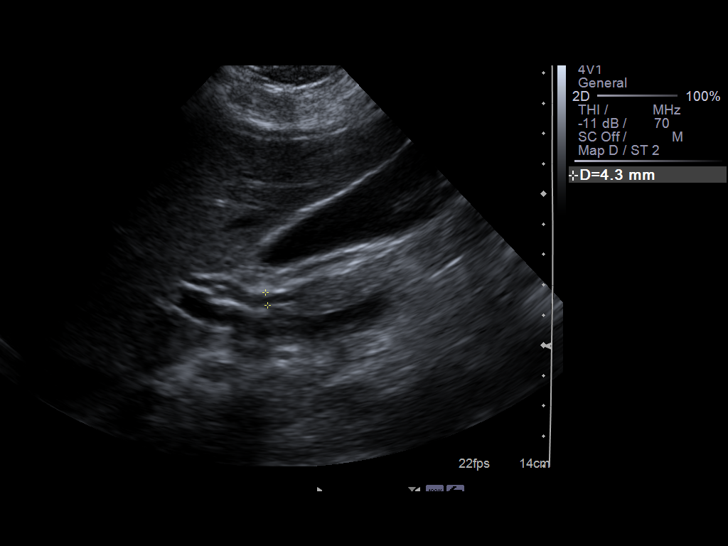

[14 of 25 positions shown; findings below may reference images not displayed]

FINDINGS: Gallbladder:  Multiple stones are seen in the gallbladder lumen.
Stones measure up to about 2 cm in diameter.  There is no
gallbladder wall thickening or pericholecystic fluid.  The
sonographer reports no sonographic Murphy's sign.

Common Bile Duct:  Nondilated at 4 mm diameter.

Liver:  Coarsening of the echotexture suggest fatty infiltration.
No intrahepatic biliary duct dilatation.  No evidence for
intraparenchymal mass lesion.

IVC:  Normal.

Pancreas:  Normal.

Spleen:  Normal.

Right kidney:  11.8 cm in long axis.  Normal.

Left kidney:  11.0 cm in long axis.  Normal.

Abdominal Aorta:  No aneurysm.
IMPRESSION: Cholelithiasis without gallbladder wall thickening, ductal
dilatation, or pericholecystic fluid.

## 2014-01-22 ENCOUNTER — Ambulatory Visit (INDEPENDENT_AMBULATORY_CARE_PROVIDER_SITE_OTHER): Payer: 59 | Admitting: Internal Medicine

## 2014-01-22 ENCOUNTER — Encounter: Payer: Self-pay | Admitting: Internal Medicine

## 2014-01-22 VITALS — BP 118/80 | HR 85 | Temp 98.1°F | Resp 18 | Ht 65.5 in | Wt 239.8 lb

## 2014-01-22 DIAGNOSIS — J019 Acute sinusitis, unspecified: Secondary | ICD-10-CM | POA: Insufficient documentation

## 2014-01-22 DIAGNOSIS — F17200 Nicotine dependence, unspecified, uncomplicated: Secondary | ICD-10-CM | POA: Insufficient documentation

## 2014-01-22 MED ORDER — AMOXICILLIN-POT CLAVULANATE 875-125 MG PO TABS: 1.0000 | ORAL_TABLET | Freq: Two times a day (BID) | ORAL | Status: DC

## 2014-01-22 MED ORDER — FLUCONAZOLE 150 MG PO TABS: 150.0000 mg | ORAL_TABLET | Freq: Once | ORAL | Status: DC

## 2014-01-22 NOTE — Progress Notes (Signed)
Patient ID: Kendra Ruiz, female   DOB: 1981/07/30, 33 y.o.   MRN: [id number]    Chief Complaint  Patient presents with  . Acute Visit    fever, sore throat, congestion   HPI 33 y/o female patient is here for acute visit. She started feeling tired Monday after work. Her whole body was aching. She then developed chills and measure her temperature and had fever of 102. She went to the ED adn had rapid strept throat test which was negative. She continues to have temperature and last night was 100.5. She now has sore throat, difficulty swallowing, stuffed ears. Her ear aches with swallowing. Has greenish snot coming out of her nose. 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 Denies cough or runny nose Appetite has decreased Denies any nausea or vomiting had some dizziness this am No falls reported No chest pain or trouble breathing Continues to smoke  ROS Negative except for ones in HPI  Past Medical History  Diagnosis Date  . Allergic rhinitis   . Abdominal cramping   . Anxiety   . Cough    Medication reviewed. See [facility]  Physical exam BP 118/80  Pulse 85  Temp(Src) 98.1 F (36.7 C) (Oral)  Resp 18  Ht 5' 5.5" (1.664 m)  Wt 239 lb 12.8 oz (108.773 kg)  BMI 39.28 kg/m2  SpO2 97%  General- adult female in no acute distress Head- atraumatic, normocephalic Eyes- PERRLA, EOMI, no pallor, no icterus, no discharge Ears- left ear tympanic membrane inflamed and normal external ear canal , right ear normal tympanic membrane and normal external ear canal Neck- left anterior cervical lymphadenopathy, no thyromegaly Nose- red and swollen nasal mucosa, left maxillary sinus tenderness, no frontal sinus tenderness Mouth- normal mucus membrane, no oral thrush, oropharyngeal erythema Cardiovascular- normal s1,s2, no murmurs Respiratory- bilateral clear to auscultation, no wheeze, no rhonchi, no crackles Abdomen- bowel sounds present, soft, non  tender Skin- warm and dry Psychiatry- alert and oriented to person, place and time, normal mood and affect  Assessment/plan  1. Acute sinusitis Will have her on augmentin 875 mg bid for 10 days to help treat acute sinusitis. Advised to stay out of work for next 3 days to see if her medical illness improves. She is prone to yeast infection while on antibiotics, thus provided fluconazole 150 mg 1 dose. losenges for her throat, adequate hydration and rest advised. To notify if continues to have temperature spike or worsening of her symptoms. Note for out of work provided - CBC with Differential  2. Tobacco use disorder Encouraged to stop smoking. Advised on different smoking cessation method and will review this further on her follow up visit.

## 2014-01-23 LAB — CBC WITH DIFFERENTIAL/PLATELET
BASOS ABS: 0 10*3/uL (ref 0.0–0.2)
Basos: 0 %
EOS: 1 %
Eosinophils Absolute: 0.2 10*3/uL (ref 0.0–0.4)
HCT: 39.1 % (ref 34.0–46.6)
Hemoglobin: 13.1 g/dL (ref 11.1–15.9)
IMMATURE GRANS (ABS): 0 10*3/uL (ref 0.0–0.1)
IMMATURE GRANULOCYTES: 0 %
Lymphocytes Absolute: 2.4 10*3/uL (ref 0.7–3.1)
Lymphs: 17 %
MCH: 30.5 pg (ref 26.6–33.0)
MCHC: 33.5 g/dL (ref 31.5–35.7)
MCV: 91 fL (ref 79–97)
MONOCYTES: 9 %
MONOS ABS: 1.3 10*3/uL — AB (ref 0.1–0.9)
NEUTROS PCT: 73 %
Neutrophils Absolute: 9.9 10*3/uL — ABNORMAL HIGH (ref 1.4–7.0)
RBC: 4.29 x10E6/uL (ref 3.77–5.28)
RDW: 13.5 % (ref 12.3–15.4)
WBC: 13.8 10*3/uL — AB (ref 3.4–10.8)

## 2014-02-19 ENCOUNTER — Encounter: Payer: Self-pay | Admitting: Internal Medicine

## 2014-02-19 ENCOUNTER — Ambulatory Visit (INDEPENDENT_AMBULATORY_CARE_PROVIDER_SITE_OTHER): Payer: 59 | Admitting: Internal Medicine

## 2014-02-19 VITALS — BP 130/82 | HR 97 | Temp 99.0°F | Resp 12 | Ht 65.5 in | Wt 245.0 lb

## 2014-02-19 DIAGNOSIS — G4700 Insomnia, unspecified: Secondary | ICD-10-CM

## 2014-02-19 DIAGNOSIS — R739 Hyperglycemia, unspecified: Secondary | ICD-10-CM

## 2014-02-19 DIAGNOSIS — F3289 Other specified depressive episodes: Secondary | ICD-10-CM

## 2014-02-19 DIAGNOSIS — F32A Depression, unspecified: Secondary | ICD-10-CM

## 2014-02-19 DIAGNOSIS — D72829 Elevated white blood cell count, unspecified: Secondary | ICD-10-CM

## 2014-02-19 DIAGNOSIS — F329 Major depressive disorder, single episode, unspecified: Secondary | ICD-10-CM

## 2014-02-19 DIAGNOSIS — F17200 Nicotine dependence, unspecified, uncomplicated: Secondary | ICD-10-CM

## 2014-02-19 DIAGNOSIS — E663 Overweight: Secondary | ICD-10-CM

## 2014-02-19 DIAGNOSIS — M25569 Pain in unspecified knee: Secondary | ICD-10-CM

## 2014-02-19 DIAGNOSIS — Z0000 Encounter for general adult medical examination without abnormal findings: Secondary | ICD-10-CM

## 2014-02-19 DIAGNOSIS — R7303 Prediabetes: Secondary | ICD-10-CM

## 2014-02-19 DIAGNOSIS — R7309 Other abnormal glucose: Secondary | ICD-10-CM

## 2014-02-19 MED ORDER — NICOTINE 21-14-7 MG/24HR TD KIT: PACK | TRANSDERMAL | Status: DC

## 2014-02-19 NOTE — Progress Notes (Signed)
Patient ID: Kendra DarkShermaine Ruiz, female   DOB: 01/29/1981, 33 y.o.   MRN: [id number]      No Known Allergies  Chief Complaint  Patient presents with  . Annual Exam    Yearly exam , pap completed by GYN 2014  . Depression    Discuss depression management. Patient was on medication and d/c due to weight gain   . Weight Gain    Patient concerned about weight gain, no change in eating habits     HPI:  33 y.o female patient is here for her annual exam. She has not eaten anything since 6 am this morning. No recent labs.  She does not exercise She has sedentary lifestyle Eats all her meals from outside. Mostly fried or junk food Concerned about weight gain and willing to change her lifestyle Feels low with her mood and energy intermittently and has stopped her antidepressants tried in the past. Mood ok today She is smoking half a pack a day and would like to quit smoking  Review of Systems:  Constitutional: Negative for fever, chills, diaphoresis. low in terms of her energy HENT: Negative for congestion, hearing loss and sore throat.   Eyes: Negative for eye pain, blurred vision, double vision and discharge. uptodate with eye exam Respiratory: Negative for cough, sputum production, shortness of breath and wheezing.  recent URI resolved Cardiovascular: Negative for chest pain, palpitations, orthopnea, uses 2 pillows to sleep and denies leg swelling.  Gastrointestinal: Negative for nausea, vomiting, abdominal pain, diarrhea and constipation. has heartburns and sour brash in her mouth occasionally 3 times a week Genitourinary: Negative for dysuria, urgency, frequency, hematuria and flank pain.  Musculoskeletal: Negative for back pain, falls, myalgias. has joint aches Skin: Negative for itching and rash.  Neurological: Negative for weakness,dizziness, tingling, focal weakness and headaches.  Psychiatric/Behavioral:  The patient is not nervous/anxious.  mood has been overall ok for now  Wt  Readings from Last 3 Encounters:  02/19/14 245 lb (111.131 kg)  01/22/14 239 lb 12.8 oz (108.773 kg)  05/14/13 240 lb 12.8 oz (109.226 kg)    Past Medical History  Diagnosis Date  . Allergic rhinitis   . Abdominal cramping   . Anxiety   . Cough    Past Surgical History  Procedure Laterality Date  . Cholecystectomy  2013   Social History:   reports that she has been smoking Cigarettes.  She has a 5 pack-year smoking history. She does not have any smokeless tobacco history on file. She reports that she drinks alcohol. She reports that she does not use illicit drugs.  Family History  Problem Relation Age of Onset  . Diabetes Sister   . Hypertension Sister   . Bipolar disorder Sister   . Bipolar disorder Sister   . Obesity Sister     Medications: Patient's Medications  New Prescriptions   No medications on file  Previous Medications   ASPIRIN-ACETAMINOPHEN-CAFFEINE (EXCEDRIN MIGRAINE) 250-250-65 MG PER TABLET    Take 2 tablets by mouth daily as needed. For headache.   MULTIPLE VITAMINS-MINERALS (MULTIVITAMINS THER. W/MINERALS) TABS    Take 1 tablet by mouth daily.   TEMAZEPAM (RESTORIL) 7.5 MG CAPSULE    Take 7.5 mg by mouth at bedtime as needed for sleep.  Modified Medications   No medications on file  Discontinued Medications   AMOXICILLIN-CLAVULANATE (AUGMENTIN) 875-125 MG PER TABLET    Take 1 tablet by mouth 2 (two) times daily.   FLUCONAZOLE (DIFLUCAN) 150 MG TABLET    Take  1 tablet (150 mg total) by mouth once.     Physical Exam: Filed Vitals:   02/19/14 1411  BP: 130/82  Pulse: 97  Temp: 99 F (37.2 C)  TempSrc: Oral  Resp: 12  Height: 5' 5.5" (1.664 m)  Weight: 245 lb (111.131 kg)  SpO2: 98%   BP 130/82  Pulse 97  Temp(Src) 99 F (37.2 C) (Oral)  Resp 12  Ht 5' 5.5" (1.664 m)  Wt 245 lb (111.131 kg)  BMI 40.14 kg/m2  SpO2 98%  General- adult female in no acute distress, morbidly obese Head- atraumatic, normocephalic Eyes- PERRLA, EOMI, no  pallor, no icterus, no discharge Neck- no lymphadenopathy, no thyromegaly, no jugular vein distension, no carotid bruit Ears- left ear normal tympanic membrane and normal external ear canal , right ear normal tympanic membrane and normal external ear canal Throat- moist mucus membrane, normal oropharynx Nose- normal nasal mucosa, no maxillary or frontal sinus tenderness Chest- no chest wall deformities, no chest wall tenderness Breast- with her Gyn Cardiovascular- normal s1,s2, no murmurs/ rubs/ gallops Respiratory- bilateral clear to auscultation, no wheeze, no rhonchi, no crackles, no use of accessory muscles Abdomen- bowel sounds present, soft, non tender, no organomegaly, no abdominal bruits, no guarding or rigidity, no CVA tenderness Pelvic exam- with her gyn Musculoskeletal- able to move all 4 extremities, no spinal and paraspinal tenderness, steady gait, no use of assistive device, normal range of motion, no leg edema Neurological- no focal deficit, normal reflexes, normal muscle strength, normal sensation to fine touch and vibration Skin- warm and dry Psychiatry- alert and oriented to person, place and time, normal mood and affect    Labs reviewed: Basic Metabolic Panel:  Recent Labs  21/30/86 0940  NA 137  K 4.5  CL 101  CO2 19  GLUCOSE 90  BUN 8  CREATININE 0.61  CALCIUM 9.2   Liver Function Tests:  Recent Labs  05/14/13 0940  AST 27  ALT 26  ALKPHOS 59  BILITOT 0.2  PROT 6.5   No results found for this basename: LIPASE, AMYLASE,  in the last 8760 hours No results found for this basename: AMMONIA,  in the last 8760 hours CBC:  Recent Labs  05/14/13 0940 01/22/14 1243  WBC 9.8 13.8*  NEUTROABS 6.7 9.9*  HGB 13.0 13.1  HCT 39.5 39.1  MCV 92 91   Lab Results  Component Value Date   HGBA1C 6.4* 05/14/2013    Assessment/Plan  1. Depression phq score of 8. Pt would like to do behavioral modification first. She wants to try with healthy lifestyle  and group exercise classes. If that does not help, she would prefer psychology counselling and then medication trial. Will proceed accordingly. Warning signs with worsening symptoms explained - TSH  2. Hyperglycemia Check cmp and a1c  3. Insomnia restoril prn has been helpful, monitor  4. Routine general medical examination at a health care facility the patient was counseled regarding the appropriate use of alcohol, regular self-examination of the breasts on a monthly basis, prevention of dental and periodontal disease, diet, regular sustained exercise for at least 30 minutes 5 times per week, routine screening interval for mammogram as recommended by the American Cancer Society and ACOG, the proper use of sunscreen and protective clothing, tobacco use, and recommended cholesterol, thyroid and diabetes screening.  5. Tobacco use disorder Will have her on titration nicotine patch. counselled not to smoke with use of nicotine patch  6. Obesity counselled on weight loss, portion meals, exercise regimen-  patient willing to do portion meals, calorie counting and walking for exercise - CMP - Lipid Panel - TSH - Vitamin D, 1,25-dihydroxy  7. Leukocytosis, unspecified Likely in setting of recent sinusitis. Afebrile at present. Re check in routine labs - CBC with Differential  8. Knee pain, unspecified laterality - Vitamin D, 1,25-dihydroxy  9. Prediabetes With her obesity rule out DM - Hemoglobin A1C   Labs/tests ordered- cbc, cmp, a1c, tsh, vit d, lipid    Oneal GroutMAHIMA Zeeva Courser, MD  [facility] [phone] (Monday-Friday 8 am - 5 pm) [phone] (afterhours)

## 2014-02-20 LAB — CBC WITH DIFFERENTIAL/PLATELET
BASOS: 0 %
Basophils Absolute: 0 10*3/uL (ref 0.0–0.2)
EOS ABS: 0.1 10*3/uL (ref 0.0–0.4)
EOS: 1 %
HCT: 39.6 % (ref 34.0–46.6)
Hemoglobin: 12.8 g/dL (ref 11.1–15.9)
IMMATURE GRANS (ABS): 0 10*3/uL (ref 0.0–0.1)
Immature Granulocytes: 0 %
Lymphocytes Absolute: 2.5 10*3/uL (ref 0.7–3.1)
Lymphs: 29 %
MCH: 29.7 pg (ref 26.6–33.0)
MCHC: 32.3 g/dL (ref 31.5–35.7)
MCV: 92 fL (ref 79–97)
Monocytes Absolute: 0.5 10*3/uL (ref 0.1–0.9)
Monocytes: 6 %
NEUTROS PCT: 64 %
Neutrophils Absolute: 5.6 10*3/uL (ref 1.4–7.0)
RBC: 4.31 x10E6/uL (ref 3.77–5.28)
RDW: 13.6 % (ref 12.3–15.4)
WBC: 8.8 10*3/uL (ref 3.4–10.8)

## 2014-02-20 LAB — COMPREHENSIVE METABOLIC PANEL
ALK PHOS: 92 IU/L (ref 39–117)
ALT: 12 IU/L (ref 0–32)
AST: 16 IU/L (ref 0–40)
Albumin/Globulin Ratio: 1.4 (ref 1.1–2.5)
Albumin: 4.1 g/dL (ref 3.5–5.5)
BUN/Creatinine Ratio: 9 (ref 8–20)
BUN: 7 mg/dL (ref 6–20)
CALCIUM: 9.4 mg/dL (ref 8.7–10.2)
CHLORIDE: 98 mmol/L (ref 97–108)
CO2: 24 mmol/L (ref 18–29)
CREATININE: 0.79 mg/dL (ref 0.57–1.00)
GFR calc Af Amer: 114 mL/min/{1.73_m2} (ref >59)
GFR calc non Af Amer: 99 mL/min/{1.73_m2} (ref >59)
GLOBULIN, TOTAL: 3 g/dL (ref 1.5–4.5)
Glucose: 82 mg/dL (ref 65–99)
Potassium: 4.3 mmol/L (ref 3.5–5.2)
SODIUM: 136 mmol/L (ref 134–144)
Total Bilirubin: 0.4 mg/dL (ref 0.0–1.2)
Total Protein: 7.1 g/dL (ref 6.0–8.5)

## 2014-02-20 LAB — LIPID PANEL
CHOL/HDL RATIO: 2.6 ratio (ref 0.0–4.4)
CHOLESTEROL TOTAL: 147 mg/dL (ref 100–199)
HDL: 56 mg/dL (ref >39)
LDL Calculated: 80 mg/dL (ref 0–99)
TRIGLYCERIDES: 57 mg/dL (ref 0–149)
VLDL Cholesterol Cal: 11 mg/dL (ref 5–40)

## 2014-02-20 LAB — HEMOGLOBIN A1C
ESTIMATED AVERAGE GLUCOSE: 126 mg/dL
HEMOGLOBIN A1C: 6 % — AB (ref 4.8–5.6)

## 2014-02-20 LAB — VITAMIN D 1,25 DIHYDROXY: Vit D, 1,25-Dihydroxy: 15 pg/mL — ABNORMAL LOW (ref 19.9–79.3)

## 2014-02-20 LAB — TSH: TSH: 1.69 u[IU]/mL (ref 0.450–4.500)

## 2014-02-26 ENCOUNTER — Telehealth: Payer: Self-pay

## 2014-02-26 MED ORDER — VITAMIN D (ERGOCALCIFEROL) 1.25 MG (50000 UNIT) PO CAPS: ORAL_CAPSULE | ORAL | Status: DC

## 2014-02-26 NOTE — Telephone Encounter (Signed)
Your vitamin d level is low. This could be causing aches and low energy level for you. Vitamin d 50,000 iu once week for 12 weeks and then recheck vitamin d level  Left message on voicemail for patient to return call when available

## 2014-02-26 NOTE — Telephone Encounter (Signed)
Spoke with patient, patient verbalized understanding of results. RX sent to pharmacy. Copy of report mailed to patient

## 2014-04-24 ENCOUNTER — Telehealth: Payer: Self-pay | Admitting: *Deleted

## 2014-04-24 MED ORDER — AMOXICILLIN-POT CLAVULANATE 875-125 MG PO TABS: ORAL_TABLET | ORAL | Status: DC

## 2014-04-24 NOTE — Telephone Encounter (Signed)
Patient Notified and faxed Rx to pharmacy. 

## 2014-04-24 NOTE — Telephone Encounter (Signed)
Patient called and stated that she is has a sinus infection, green mucus and sinus pressure. Please Advise. Called Dr. Glade Lloyd-- Stop smoking. Augmentin 875 one tablet twice daily for 1 week.   Call and [facility] for patient to return call.

## 2014-08-26 ENCOUNTER — Encounter: Payer: Self-pay | Admitting: Internal Medicine

## 2014-08-26 ENCOUNTER — Ambulatory Visit (INDEPENDENT_AMBULATORY_CARE_PROVIDER_SITE_OTHER): Payer: BLUE CROSS/BLUE SHIELD | Admitting: Internal Medicine

## 2014-08-26 VITALS — BP 132/96 | HR 88 | Temp 97.9°F | Wt 252.0 lb

## 2014-08-26 DIAGNOSIS — E669 Obesity, unspecified: Secondary | ICD-10-CM

## 2014-08-26 DIAGNOSIS — IMO0001 Reserved for inherently not codable concepts without codable children: Secondary | ICD-10-CM

## 2014-08-26 DIAGNOSIS — K219 Gastro-esophageal reflux disease without esophagitis: Secondary | ICD-10-CM

## 2014-08-26 DIAGNOSIS — F329 Major depressive disorder, single episode, unspecified: Secondary | ICD-10-CM

## 2014-08-26 DIAGNOSIS — F32A Depression, unspecified: Secondary | ICD-10-CM

## 2014-08-26 DIAGNOSIS — R7309 Other abnormal glucose: Secondary | ICD-10-CM

## 2014-08-26 DIAGNOSIS — F17200 Nicotine dependence, unspecified, uncomplicated: Secondary | ICD-10-CM

## 2014-08-26 DIAGNOSIS — Z720 Tobacco use: Secondary | ICD-10-CM

## 2014-08-26 DIAGNOSIS — R030 Elevated blood-pressure reading, without diagnosis of hypertension: Secondary | ICD-10-CM

## 2014-08-26 DIAGNOSIS — R7303 Prediabetes: Secondary | ICD-10-CM

## 2014-08-26 MED ORDER — HYDROCHLOROTHIAZIDE 25 MG PO TABS
25.0000 mg | ORAL_TABLET | Freq: Every day | ORAL | Status: DC
Start: 2014-08-26 — End: 2022-12-27

## 2014-08-26 MED ORDER — OMEPRAZOLE 20 MG PO CPDR: DELAYED_RELEASE_CAPSULE | ORAL | Status: AC

## 2014-08-26 MED ORDER — BUPROPION HCL ER (SR) 150 MG PO TB12: ORAL_TABLET | ORAL | Status: DC

## 2014-08-26 NOTE — Progress Notes (Signed)
Patient ID: Kendra Ruiz, female   DOB: 01-26-1981, 34 y.o.   MRN: [id number]    Chief Complaint  Patient presents with  . Medical Management of Chronic Issues    6 month ov for depression, insomnia, hyperglycemia  . Depression    wants to talk about taking something for this  . Nicotine Dependence    wants to talk about getting help to stop smoking   No Known Allergies  HPI:   34 y.o female patient is here for her routine visit. She tried to quit smoking cold [place] way and was able to quit for 2 weeks. She has started back. She does not smoke in the house but is currently smoking 7-8 cigarettes a day. She was not able to get her nicotine patch. She continues to feel depressed she has been trying to write down her emotion. She gets angry easily and feels low. She does not feel like going to work. She has tried celexa in past but this did not help. It also caused her to gain pounds. She has cut on her fast food, has been preparing meals at home more often and has cut down on soda intake. She does not exercise. She feels low in terms of her energy.  Wt Readings from Last 3 Encounters:  08/26/14 252 lb (114.306 kg)  02/19/14 245 lb (111.131 kg)  01/22/14 239 lb 12.8 oz (108.773 kg)     Review of Systems:  Constitutional: Negative for fever, chills. Feels low in terms of her energy HENT: Negative for congestion,sore throat.   Eyes: Negative for eye pain, blurred vision, double vision and discharge. uptodate with eye exam Respiratory: Negative for cough, shortness of breath and wheezing.  Cardiovascular: Negative for chest pain, palpitations, orthopnea, uses 2 pillows to sleep (at baseline). Has noticed swelling in her legs Gastrointestinal: Negative for nausea, vomiting, abdominal pain, diarrhea and constipation. has heartburns more often and sometimes it wakes her from sleep Genitourinary: Negative for dysuria, urgency, frequency, hematuria and flank pain.  Musculoskeletal: Negative  for back pain, falls Skin: Negative for itching and rash.  Neurological: Negative for weakness,dizziness, tingling, focal weakness and headaches.  Psychiatric/Behavioral:  The patient is not nervous/anxious. restoril helps her sleep  Past Medical History  Diagnosis Date  . Allergic rhinitis   . Abdominal cramping   . Anxiety   . Cough    Current Outpatient Prescriptions on File Prior to Visit  Medication Sig Dispense Refill  . aspirin-acetaminophen-caffeine (EXCEDRIN MIGRAINE) 250-250-65 MG per tablet Take 2 tablets by mouth daily as needed. For headache.    . Multiple Vitamins-Minerals (MULTIVITAMINS THER. W/MINERALS) TABS Take 1 tablet by mouth daily.    . temazepam (RESTORIL) 7.5 MG capsule Take 7.5 mg by mouth at bedtime as needed for sleep.     No current facility-administered medications on file prior to visit.    Physical exam BP 132/96 mmHg  Pulse 88  Temp(Src) 97.9 F (36.6 C) (Oral)  Wt 252 lb (114.306 kg)  SpO2 99%  General- adult female in no acute distress,obese Head- atraumatic, normocephalic Eyes- PERRLA, EOMI, no pallor, no icterus, no discharge Neck- no lymphadenopathy Throat- moist mucus membrane Cardiovascular- normal s1,s2, no murmurs/ rubs/ gallops Respiratory- bilateral clear to auscultation, no wheeze, no rhonchi, no crackles, no use of accessory muscles Abdomen- bowel sounds present, soft, non tender Musculoskeletal- able to move all 4 extremities, trace leg edema Neurological- no focal deficit Skin- warm and dry Psychiatry- alert and oriented to person, place and time,  normal mood and affect  Labs- CBC    Component Value Date/Time   WBC 8.8 02/19/2014 1514   WBC 9.2 12/14/2011 1005   RBC 4.31 02/19/2014 1514   RBC 4.08 12/14/2011 1005   HGB 12.8 02/19/2014 1514   HCT 39.6 02/19/2014 1514   PLT 337 12/14/2011 1005   MCV 92 02/19/2014 1514   MCH 29.7 02/19/2014 1514   MCH 31.1 12/14/2011 1005   MCHC 32.3 02/19/2014 1514   MCHC 34.4  12/14/2011 1005   RDW 13.6 02/19/2014 1514   RDW 13.0 12/14/2011 1005   LYMPHSABS 2.5 02/19/2014 1514   LYMPHSABS 1.9 12/14/2011 1005   MONOABS 0.4 12/14/2011 1005   EOSABS 0.1 02/19/2014 1514   EOSABS 0.1 12/14/2011 1005   BASOSABS 0.0 02/19/2014 1514   BASOSABS 0.0 12/14/2011 1005    CMP     Component Value Date/Time   NA 136 02/19/2014 1514   NA 138 12/14/2011 1005   K 4.3 02/19/2014 1514   CL 98 02/19/2014 1514   CO2 24 02/19/2014 1514   GLUCOSE 82 02/19/2014 1514   GLUCOSE 97 12/14/2011 1005   BUN 7 02/19/2014 1514   BUN 7 12/14/2011 1005   CREATININE 0.79 02/19/2014 1514   CALCIUM 9.4 02/19/2014 1514   PROT 7.1 02/19/2014 1514   PROT 7.1 12/14/2011 1005   ALBUMIN 3.2* 12/14/2011 1005   AST 16 02/19/2014 1514   ALT 12 02/19/2014 1514   ALKPHOS 92 02/19/2014 1514   BILITOT 0.4 02/19/2014 1514   GFRNONAA 99 02/19/2014 1514   GFRAA 114 02/19/2014 1514   Lab Results  Component Value Date   HGBA1C 6.0* 02/19/2014   Lipid Panel     Component Value Date/Time   TRIG 57 02/19/2014 1514   HDL 56 02/19/2014 1514   CHOLHDL 2.6 02/19/2014 1514   LDLCALC 80 02/19/2014 1514   Assessment/plan  1. Depression Denies suicidal ideation. Will start her on bupropion 150 mg daily x 3 days, then 150 mg bid and reassess in 6 weeks. Common side effect explained - CMP  2. Smoking Will try bupropion as above to help with smoking cessation  3. Prediabetes Check a1c given her family hx, her own hx of prediabetes and her increased weight. Also assess her thyroid function - Hemoglobin A1c  4. Obesity Has further gained weight. Her sedentary life style likely contributing to this. Will assess for dm and thyroid abnormality again. Encouraged exercise,dietary counselling.  5. Elevated blood pressure 3 readings in office on different time interval all elevated. No symptoms at present. Start hctz 25 mg daily for now and reassess. Check bmp today - CMP  6. Gastroesophageal reflux  disease without esophagitis Start omeprazole 20 mg bid x 1 week and then once a day, avoid late meals, weight loss encouraged.    Spent more than 40 minutes with patient today

## 2014-08-26 NOTE — Patient Instructions (Signed)
Check blood pressure reading at home once a day for now  Take your medication as prescribed  Try to exercise for atleast 30 minutes 5 days a week

## 2014-08-27 LAB — COMPREHENSIVE METABOLIC PANEL
ALT: 16 IU/L (ref 0–32)
AST: 23 IU/L (ref 0–40)
Albumin/Globulin Ratio: 1.3 (ref 1.1–2.5)
Albumin: 3.9 g/dL (ref 3.5–5.5)
Alkaline Phosphatase: 90 IU/L (ref 39–117)
BILIRUBIN TOTAL: 0.3 mg/dL (ref 0.0–1.2)
BUN/Creatinine Ratio: 12 (ref 8–20)
BUN: 8 mg/dL (ref 6–20)
CHLORIDE: 104 mmol/L (ref 97–108)
CO2: 24 mmol/L (ref 18–29)
Calcium: 9.5 mg/dL (ref 8.7–10.2)
Creatinine, Ser: 0.67 mg/dL (ref 0.57–1.00)
GFR calc non Af Amer: 115 mL/min/{1.73_m2} (ref >59)
GFR, EST AFRICAN AMERICAN: 133 mL/min/{1.73_m2} (ref >59)
GLUCOSE: 108 mg/dL — AB (ref 65–99)
Globulin, Total: 2.9 g/dL (ref 1.5–4.5)
POTASSIUM: 4.2 mmol/L (ref 3.5–5.2)
SODIUM: 140 mmol/L (ref 134–144)
TOTAL PROTEIN: 6.8 g/dL (ref 6.0–8.5)

## 2014-08-27 LAB — HEMOGLOBIN A1C
ESTIMATED AVERAGE GLUCOSE: 134 mg/dL
Hgb A1c MFr Bld: 6.3 % — ABNORMAL HIGH (ref 4.8–5.6)

## 2014-08-27 LAB — TSH: TSH: 1.04 u[IU]/mL (ref 0.450–4.500)

## 2014-09-01 ENCOUNTER — Telehealth: Payer: Self-pay | Admitting: *Deleted

## 2014-09-01 NOTE — Telephone Encounter (Signed)
Patient called to get results of her labs and was informed that her labs was mailed to her all so.

## 2014-10-21 ENCOUNTER — Ambulatory Visit: Payer: BLUE CROSS/BLUE SHIELD | Admitting: Internal Medicine

## 2014-10-21 DIAGNOSIS — Z0289 Encounter for other administrative examinations: Secondary | ICD-10-CM

## 2014-10-22 ENCOUNTER — Encounter: Payer: Self-pay | Admitting: Internal Medicine

## 2015-05-09 ENCOUNTER — Emergency Department (HOSPITAL_COMMUNITY): Payer: 59

## 2015-05-09 ENCOUNTER — Encounter (HOSPITAL_COMMUNITY): Payer: Self-pay | Admitting: Emergency Medicine

## 2015-05-09 ENCOUNTER — Emergency Department (HOSPITAL_COMMUNITY)
Admission: EM | Admit: 2015-05-09 | Discharge: 2015-05-09 | Disposition: A | Discharge status: Home / Self Care | Payer: 59 | Attending: Emergency Medicine | Admitting: Emergency Medicine

## 2015-05-09 DIAGNOSIS — R079 Chest pain, unspecified: Secondary | ICD-10-CM | POA: Insufficient documentation

## 2015-05-09 DIAGNOSIS — Z3202 Encounter for pregnancy test, result negative: Secondary | ICD-10-CM | POA: Diagnosis not present

## 2015-05-09 DIAGNOSIS — K219 Gastro-esophageal reflux disease without esophagitis: Secondary | ICD-10-CM | POA: Diagnosis not present

## 2015-05-09 DIAGNOSIS — I10 Essential (primary) hypertension: Secondary | ICD-10-CM | POA: Diagnosis not present

## 2015-05-09 DIAGNOSIS — Z79899 Other long term (current) drug therapy: Secondary | ICD-10-CM | POA: Diagnosis not present

## 2015-05-09 DIAGNOSIS — Z720 Tobacco use: Secondary | ICD-10-CM | POA: Insufficient documentation

## 2015-05-09 DIAGNOSIS — F419 Anxiety disorder, unspecified: Secondary | ICD-10-CM | POA: Diagnosis not present

## 2015-05-09 DIAGNOSIS — R002 Palpitations: Secondary | ICD-10-CM | POA: Insufficient documentation

## 2015-05-09 HISTORY — DX: Headache, unspecified: R51.9

## 2015-05-09 HISTORY — DX: Headache: R51

## 2015-05-09 HISTORY — DX: Gastro-esophageal reflux disease without esophagitis: K21.9

## 2015-05-09 HISTORY — DX: Essential (primary) hypertension: I10

## 2015-05-09 LAB — BASIC METABOLIC PANEL
Anion gap: 7 (ref 5–15)
BUN: 8 mg/dL (ref 6–20)
CO2: 24 mmol/L (ref 22–32)
CREATININE: 0.79 mg/dL (ref 0.44–1.00)
Calcium: 8.5 mg/dL — ABNORMAL LOW (ref 8.9–10.3)
Chloride: 106 mmol/L (ref 101–111)
GFR calc Af Amer: >60 mL/min (ref >60)
GLUCOSE: 151 mg/dL — AB (ref 65–99)
Potassium: 3 mmol/L — ABNORMAL LOW (ref 3.5–5.1)
Sodium: 137 mmol/L (ref 135–145)

## 2015-05-09 LAB — URINALYSIS, ROUTINE W REFLEX MICROSCOPIC
BILIRUBIN URINE: NEGATIVE
Glucose, UA: NEGATIVE mg/dL
KETONES UR: NEGATIVE mg/dL
Leukocytes, UA: NEGATIVE
NITRITE: NEGATIVE
Protein, ur: NEGATIVE mg/dL
SPECIFIC GRAVITY, URINE: 1.025 (ref 1.005–1.030)
UROBILINOGEN UA: 0.2 mg/dL (ref 0.0–1.0)
pH: 5.5 (ref 5.0–8.0)

## 2015-05-09 LAB — CBC WITH DIFFERENTIAL/PLATELET
BASOS ABS: 0 10*3/uL (ref 0.0–0.1)
Basophils Relative: 0 %
Eosinophils Absolute: 0.2 10*3/uL (ref 0.0–0.7)
Eosinophils Relative: 2 %
HCT: 34.6 % — ABNORMAL LOW (ref 36.0–46.0)
HEMOGLOBIN: 11.5 g/dL — AB (ref 12.0–15.0)
Lymphocytes Relative: 29 %
Lymphs Abs: 2.7 10*3/uL (ref 0.7–4.0)
MCH: 30.3 pg (ref 26.0–34.0)
MCHC: 33.2 g/dL (ref 30.0–36.0)
MCV: 91.1 fL (ref 78.0–100.0)
MONO ABS: 0.5 10*3/uL (ref 0.1–1.0)
MONOS PCT: 6 %
Neutro Abs: 5.7 10*3/uL (ref 1.7–7.7)
Neutrophils Relative %: 63 %
PLATELETS: 361 10*3/uL (ref 150–400)
RBC: 3.8 MIL/uL — AB (ref 3.87–5.11)
RDW: 13.3 % (ref 11.5–15.5)
WBC: 9.2 10*3/uL (ref 4.0–10.5)

## 2015-05-09 LAB — I-STAT TROPONIN, ED: TROPONIN I, POC: 0 ng/mL (ref 0.00–0.08)

## 2015-05-09 LAB — PREGNANCY, URINE: PREG TEST UR: NEGATIVE

## 2015-05-09 LAB — D-DIMER, QUANTITATIVE: D-Dimer, Quant: 0.32 ug/mL-FEU (ref 0.00–0.48)

## 2015-05-09 LAB — URINE MICROSCOPIC-ADD ON

## 2015-05-09 LAB — MAGNESIUM: MAGNESIUM: 1.9 mg/dL (ref 1.7–2.4)

## 2015-05-09 MED ORDER — POTASSIUM CHLORIDE CRYS ER 20 MEQ PO TBCR
40.0000 meq | EXTENDED_RELEASE_TABLET | Freq: Once | ORAL | Status: AC
  Administered 2015-05-09: 40 meq via ORAL
  Filled 2015-05-09: qty 2

## 2015-05-09 MED ORDER — SODIUM CHLORIDE 0.9 % IV BOLUS (SEPSIS)
1000.0000 mL | Freq: Once | INTRAVENOUS | Status: AC
  Administered 2015-05-09: 1000 mL via INTRAVENOUS

## 2015-05-09 NOTE — ED Notes (Signed)
Pt was at work and around 1300 had a sharp chest pain - , felt as if HR pounding  ( 130's) pain relieved after about 10 minutes and then heart continued to racing - Stated that she just didn't feel well. Denies SOB

## 2015-05-09 NOTE — ED Provider Notes (Signed)
CSN: [id number]     Arrival date & time 05/09/15  1426 History   First MD Initiated Contact with Patient 05/09/15 1434     Chief Complaint  Patient presents with  . Chest Pain  . Palpitations      HPI Pt was seen at 1440. Per pt, c/o sudden onset and resolution of one episode of "palpitations" that began today approximately 1300. States she had finished eating when her symptoms began. States she also had "sharp" chest pain that lasted approximately 10 minutes before resolving. Unclear how long her palpitations lasted. States her HR was "130's" and she sat down with it decreasing to "120's." Denies symptoms currently. Endorses hx of "tachycardia." Denies any new meds/medication changes. Denies increased caffeine intake. Denies herbal/vitamin supplements. Denies SOB/cough, no abd pain, no N/V/D, no back pain, no calf/LE pain or unilateral swelling, no fevers.    Past Medical History  Diagnosis Date  . Allergic rhinitis   . Abdominal cramping   . Anxiety   . Cough   . GERD (gastroesophageal reflux disease)   . Hypertension   . Headache    Past Surgical History  Procedure Laterality Date  . Cholecystectomy  2013   Family History  Problem Relation Age of Onset  . Diabetes Sister   . Hypertension Sister   . Bipolar disorder Sister   . Bipolar disorder Sister   . Obesity Sister    Social History  Substance Use Topics  . Smoking status: Current Every Day Smoker -- 0.50 packs/day for 10 years    Types: Cigarettes  . Smokeless tobacco: Never Used  . Alcohol Use: Yes     Comment: occasionally liquor    Review of Systems ROS: Statement: All systems negative except as marked or noted in the HPI; Constitutional: Negative for fever and chills. ; ; Eyes: Negative for eye pain, redness and discharge. ; ; ENMT: Negative for ear pain, hoarseness, nasal congestion, sinus pressure and sore throat. ; ; Cardiovascular: +CP, palpitations. Negative for diaphoresis, dyspnea and peripheral edema.  ; ; Respiratory: Negative for cough, wheezing and stridor. ; ; Gastrointestinal: Negative for nausea, vomiting, diarrhea, abdominal pain, blood in stool, hematemesis, jaundice and rectal bleeding. . ; ; Genitourinary: Negative for dysuria, flank pain and hematuria. ; ; Musculoskeletal: Negative for back pain and neck pain. Negative for swelling and trauma.; ; Skin: Negative for pruritus, rash, abrasions, blisters, bruising and skin lesion.; ; Neuro: Negative for headache, lightheadedness and neck stiffness. Negative for weakness, altered level of consciousness , altered mental status, extremity weakness, paresthesias, involuntary movement, seizure and syncope.      Allergies  Review of patient's allergies indicates no known allergies.  Home Medications   Prior to Admission medications   Medication Sig Start Date End Date Taking? Authorizing Provider  aspirin-acetaminophen-caffeine (EXCEDRIN MIGRAINE) [phone] MG per tablet Take 2 tablets by mouth daily as needed. For headache.   Yes Historical Provider, MD  hydrochlorothiazide (HYDRODIURIL) 25 MG tablet Take 1 tablet (25 mg total) by mouth daily. 08/26/14  Yes Oneal Grout, MD  Multiple Vitamins-Minerals (MULTIVITAMINS THER. W/MINERALS) TABS Take 1 tablet by mouth daily.   Yes Historical Provider, MD  omeprazole (PRILOSEC) 20 MG capsule Take one tablet twice a day for 1 week and then once a day with meals for heartburn 08/26/14  Yes Mahima Pandey, MD  temazepam (RESTORIL) 7.5 MG capsule Take 7.5 mg by mouth at bedtime as needed for sleep.   Yes Historical Provider, MD  buPROPion [facility] SR) 150  MG 12 hr tablet Take one tablet daily for 3 days, then one tablet twice a day from 08/30/14 Patient not taking: Reported on 05/09/2015 08/26/14   Oneal Grout, MD   BP 129/89 mmHg  Pulse 90  Temp(Src) 97.8 F (36.6 C) (Oral)  Resp 18  Ht 5\' 5"  (1.651 m)  Wt 249 lb (112.946 kg)  BMI 41.44 kg/m2  SpO2 100%  LMP 05/02/2015 (Exact Date)   15:13:28  Orthostatic Vital Signs SW  Orthostatic Lying  - BP- Lying: 107/79 mmHg ; Pulse- Lying: 88  Orthostatic Sitting - BP- Sitting: 115/80 mmHg ; Pulse- Sitting: 91  Orthostatic Standing at 0 minutes - BP- Standing at 0 minutes: 107/82 mmHg ; Pulse- Standing at 0 minutes: 103     Filed Vitals:   05/09/15 1440 05/09/15 1500 05/09/15 1515 05/09/15 1530  BP: 129/89 118/82 107/82 118/80  Pulse: 90 84 97   Temp: 97.8 F (36.6 C)     TempSrc: Oral     Resp: 18 17 22    Height: 5\' 5"  (1.651 m)     Weight: 249 lb (112.946 kg)     SpO2: 100% 97% 97%     Physical Exam  1445; Physical examination:  Nursing notes reviewed; Vital signs and O2 SAT reviewed;  Constitutional: Well developed, Well nourished, Well hydrated, In no acute distress; Head:  Normocephalic, atraumatic; Eyes: EOMI, PERRL, No scleral icterus; ENMT: Mouth and pharynx normal, Mucous membranes moist; Neck: Supple, Full range of motion, No lymphadenopathy; Cardiovascular: Regular rate and rhythm, No murmur, rub, or gallop; Respiratory: Breath sounds clear & equal bilaterally, No rales, rhonchi, wheezes.  Speaking full sentences with ease, Normal respiratory effort/excursion; Chest: Nontender, Movement normal; Abdomen: Soft, Nontender, Nondistended, Normal bowel sounds; Genitourinary: No CVA tenderness; Extremities: Pulses normal, No tenderness, No edema, No calf edema or asymmetry.; Neuro: AA&Ox3, Major CN grossly intact.  Speech clear. No gross focal motor or sensory deficits in extremities.; Skin: Color normal, Warm, Dry.   ED Course  Procedures (including critical care time) Labs Review   Imaging Review  I have personally reviewed and evaluated these images and lab results as part of my medical decision-making.   EKG Interpretation   Date/Time:  Saturday May 09 2015 14:35:39 EDT Ventricular Rate:  91 PR Interval:  202 QRS Duration: 99 QT Interval:  365 QTC Calculation: 449 R Axis:   94 Text Interpretation:  Sinus  rhythm Borderline prolonged PR interval  Borderline right axis deviation No old tracing to compare Confirmed by  [facility]  MD, Nicholos Johns [phone]) on 05/09/2015 2:51:06 PM      MDM  MDM Reviewed: previous chart, nursing note and vitals Reviewed previous: labs and ECG Interpretation: labs, ECG and x-ray      Results for orders placed or performed during the hospital encounter of 05/09/15  Basic metabolic panel  Result Value Ref Range   Sodium 137 135 - 145 mmol/L   Potassium 3.0 (L) 3.5 - 5.1 mmol/L   Chloride 106 101 - 111 mmol/L   CO2 24 22 - 32 mmol/L   Glucose, Bld 151 (H) 65 - 99 mg/dL   BUN 8 6 - 20 mg/dL   Creatinine, Ser 6.04 0.44 - 1.00 mg/dL   Calcium 8.5 (L) 8.9 - 10.3 mg/dL   GFR calc non Af Amer >60 >60 mL/min   GFR calc Af Amer >60 >60 mL/min   Anion gap 7 5 - 15  D-dimer, quantitative  Result Value Ref Range   D-Dimer, Quant 0.32  0.00 - 0.48 ug/mL-FEU  CBC with Differential  Result Value Ref Range   WBC 9.2 4.0 - 10.5 K/uL   RBC 3.80 (L) 3.87 - 5.11 MIL/uL   Hemoglobin 11.5 (L) 12.0 - 15.0 g/dL   HCT 29.5 (L) 62.1 - 30.8 %   MCV 91.1 78.0 - 100.0 fL   MCH 30.3 26.0 - 34.0 pg   MCHC 33.2 30.0 - 36.0 g/dL   RDW 65.7 84.6 - 96.2 %   Platelets 361 150 - 400 K/uL   Neutrophils Relative % 63 %   Neutro Abs 5.7 1.7 - 7.7 K/uL   Lymphocytes Relative 29 %   Lymphs Abs 2.7 0.7 - 4.0 K/uL   Monocytes Relative 6 %   Monocytes Absolute 0.5 0.1 - 1.0 K/uL   Eosinophils Relative 2 %   Eosinophils Absolute 0.2 0.0 - 0.7 K/uL   Basophils Relative 0 %   Basophils Absolute 0.0 0.0 - 0.1 K/uL  Pregnancy, urine  Result Value Ref Range   Preg Test, Ur NEGATIVE NEGATIVE  Urinalysis, Routine w reflex microscopic  Result Value Ref Range   Color, Urine YELLOW YELLOW   APPearance CLEAR CLEAR   Specific Gravity, Urine 1.025 1.005 - 1.030   pH 5.5 5.0 - 8.0   Glucose, UA NEGATIVE NEGATIVE mg/dL   Hgb urine dipstick SMALL (A) NEGATIVE   Bilirubin Urine NEGATIVE NEGATIVE    Ketones, ur NEGATIVE NEGATIVE mg/dL   Protein, ur NEGATIVE NEGATIVE mg/dL   Urobilinogen, UA 0.2 0.0 - 1.0 mg/dL   Nitrite NEGATIVE NEGATIVE   Leukocytes, UA NEGATIVE NEGATIVE  Magnesium  Result Value Ref Range   Magnesium 1.9 1.7 - 2.4 mg/dL  Urine microscopic-add on  Result Value Ref Range   Squamous Epithelial / LPF FEW (A) RARE   WBC, UA 0-2 <3 WBC/hpf   RBC / HPF 3-6 <3 RBC/hpf   Bacteria, UA FEW (A) RARE  I-stat troponin, ED  Result Value Ref Range   Troponin i, poc 0.00 0.00 - 0.08 ng/mL   Comment 3           Dg Chest 2 View 05/09/2015   CLINICAL DATA:  Left side chest pain, palpitations  EXAM: CHEST  2 VIEW  COMPARISON:  None.  FINDINGS: The heart size and mediastinal contours are within normal limits. Both lungs are clear. The visualized skeletal structures are unremarkable.  IMPRESSION: No active cardiopulmonary disease.   Electronically Signed   By: Natasha Mead M.D.   On: 05/09/2015 15:44    1730:  Potassium repleted PO. IVF given for mild orthostasis on VS. HR remains 80-90's, NSR on monitor. Pt states she feels better and is ready to go home now. Tx symptomatically at this time, f/u Cards for possible Holter monitor. Dx and testing d/w pt.  Questions answered.  Verb understanding, agreeable to d/c home with outpt f/u.   Samuel Jester, DO 05/13/15 1316

## 2015-05-09 NOTE — Discharge Instructions (Signed)
°Emergency Department Resource Guide °1) Find a Doctor and Pay Out of Pocket °Although you won't have to find out who is covered by your insurance plan, it is a good idea to ask around and get recommendations. You will then need to call the office and see if the doctor you have chosen will accept you as a new patient and what types of options they offer for patients who are self-pay. Some doctors offer discounts or will set up payment plans for their patients who do not have insurance, but you will need to ask so you aren't surprised when you get to your appointment. ° °2) Contact Your Local Health Department °Not all health departments have doctors that can see patients for sick visits, but many do, so it is worth a call to see if yours does. If you don't know where your local health department is, you can check in your phone book. The CDC also has a tool to help you locate your state's health department, and many state websites also have listings of all of their local health departments. ° °3) Find a Walk-in Clinic °If your illness is not likely to be very severe or complicated, you may want to try a walk in clinic. These are popping up all over the country in pharmacies, drugstores, and shopping centers. They're usually staffed by nurse practitioners or physician assistants that have been trained to treat common illnesses and complaints. They're usually fairly quick and inexpensive. However, if you have serious medical issues or chronic medical problems, these are probably not your best option. ° °No Primary Care Doctor: °- Call [facility] at  832-8000 - they can help you locate a primary care doctor that  accepts your insurance, provides certain services, etc. °- Physician Referral Service- 1-800-533-3463 ° °Chronic Pain Problems: °Organization         Address  Phone   Notes  °[facility]  ([phone]) [phone] Patients need to be referred by their primary care doctor.  ° °Medication  Assistance: °Organization         Address  Phone   Notes  °[place] [facility] [address] °[place], [place] [address] (336) 641-8030 --Must be a resident of [place] °-- Must have NO insurance coverage whatsoever (no Medicaid/ Medicare, etc.) °-- The pt. MUST have a primary care doctor that directs their care regularly and follows them in the community °  °[facility]  (866) 331-1348   °[employer]  (888) 892-1162   ° °Agencies that provide inexpensive medical care: °Organization         Address  Phone   Notes  °[facility]  (336) 832-8035   °[facility]    (336) 832-7272   °[facility] [address] °[place], [place] [address] (336) 832-4777   °[facility] [address], °[place] (336) 271-4999   °[facility]    (336) 373-0678   °[facility]    (336) 272-1050   °[facility] ° [address], [place] Phone:  (336) 832-4444, Fax:  (336) 832-4440 Hours of Operation:  9 am - 6 pm, M-F.  Also accepts Medicaid/Medicare and self-pay.  °[facility] ° [address], [place] Phone: (336) [phone], Fax: (336) [phone]. Hours of Operation:  8:30 am - 5:30 pm, M-F.  Also accepts Medicaid and self-pay.  °[facility] [place] [address], [place] Phone: (336) 878-6027   °[facility] [address], [place], [place] (336)723-1848, Ext. 123 Mondays & Thursdays: 7-9 AM.  First 15 patients are seen on a first come, first serve basis. °  ° °Medicaid-accepting [place] Providers: ° °Organization         Address  Phone   Notes  °[facility] [address], [place] (336) 641-2100 Also accepts self-pay patients.  °[facility] [address], [place] ° (336) 856-9996   °[facility] [address], [place]  (336) 288-8857   °[facility] [address] [place] [address], [place] (336) 299-7000   °Veita Bland [address], [place]  ° (336) 373-1557 Only accepts [place] Access Medicaid patients after they have their name applied to their card.  ° °Self-Pay (no insurance) in [place]: ° °Organization         Address  Phone   Notes  °Sickle Cell Patients, [facility] [address], [place] (336) 832-1970   °[facility] [address], [place] (336) 832-4400   °[facility] ° [address], [place] (336) 992-4800   °[facility]/Dr. Osei-Bonsu ° [address] [place] [address], [place] or [address], [place] (336) [phone] Phone number for both [place] and [place] locations is the same.  °[facility] [address], [place] (336) 299-0000   °[facility] [address] [place] [address], [place] or [address] (336) 852-7530 °(336) 878-2260   °[facility] [address], [place] (336) 350-1642, phone; (336) 294-5005, fax Sees patients 1st and 3rd Saturday of every month.  Must not qualify for public or private insurance (i.e. Medicaid, Medicare, [place] Health Choice, Veterans' Benefits) • Household income should be no more than 200% of the poverty level •The clinic cannot treat you if you are pregnant or think you are pregnant • Sexually transmitted diseases are not treated at the clinic.  ° ° °Dental Care: °Organization         Address  Phone  Notes  °[place] [facility] [address], [place] (336) 641-6152 Accepts children up to age 21 who are enrolled in Medicaid or [place] Health Choice; pregnant women with a Medicaid card; and children who have applied for Medicaid or [place] Health Choice, but were declined, whose parents can pay a reduced fee at time of service.  °[place]  [facility] [place]  [address], [place] (336) 641-7733 Accepts children up to age 21 who are enrolled in Medicaid or [place] Health Choice; pregnant women with a Medicaid card; and children who have applied for Medicaid or [place] Health Choice, but were declined, whose parents can pay a reduced fee at time of service.  °[facility] ° [address], [place] (336) 641-4533 Patients are seen by appointment only. Walk-ins are not accepted. [facility] will see patients 18 years of age and older. °Monday - Tuesday (8am-5pm) °Most Wednesdays (8:30-5pm) °$30 per visit, cash only  °[facility] ° [address], [place] (336) 641-4533 Patients are seen by appointment only. Walk-ins are not accepted. [facility] will see patients 18 years of age and older. °One   Wednesday Evening (Monthly: Volunteer Based).  $30 per visit, cash only  °[facility]  (919) 537-3737 for adults; Children under age 4, call Graduate Pediatric Dentistry at (919) [phone]. Children aged 4-14, please call (919) [phone] to request a pediatric application. ° Dental services are provided in all areas of dental care including fillings, crowns and bridges, complete and partial dentures, implants, gum treatment, root canals, and extractions. Preventive care is also provided. Treatment is provided to both adults and children. °Patients are selected via a lottery and there is often a waiting list. °  °[facility] [address], °[place] ° (336) 763-8833 [email or web address] °  °[facility] [address], [place], [place] (336)723-1848, Ext. 123 Second and Fourth Thursday of each month, opens at 6:30 AM; Clinic ends at 9 AM.  Patients are seen on a first-come first-served basis, and a limited number are seen during each clinic.  ° °[facility] ° [address], [place], [place] (336) 723-7904    Eligibility Requirements °You must have lived in [place], [place], or [place] counties for at least the last three months. °  You cannot be eligible for state or federal sponsored healthcare insurance, including Veterans Administration, Medicaid, or Medicare. °  You generally cannot be eligible for healthcare insurance through your employer.  °  How to apply: °Eligibility screenings are held every Tuesday and Wednesday afternoon from 1:00 pm until 4:00 pm. You do not need an appointment for the interview!  °[facility] [address], [place], [place] 336-631-2330   °[facility]  336-342-8273   °[facility]  336-703-3100   °[facility]  336-570-6415   ° °Behavioral Health Resources in the Community: °Intensive Outpatient Programs °Organization         Address  Phone  Notes  °[facility] [address], [place], [place] 336-878-6098   °[facility] [address], [place], [place] 336-832-9800   °[facility] [address], [place], [place] ° 336-882-2125   °[facility] [address],  °[place], [place] 1-800-853-5163 or 336-641-4981   °Substance Abuse Resources °Organization         Address  Phone  Notes  °[facility]  336-882-2125   °[facility]  336-784-9470   °[facility]  336-285-9073   °[facility]  336-845-3988   °Residential & Outpatient Substance Abuse Program  1-800-659-3381   °Psychological Services °Organization         Address  Phone  Notes  °[facility]  336- 832-9600   °[facility]  336- 378-7881   °[facility] [address], [place] 1-800-853-5163 or 336-641-4981   ° °Mobile Crisis Teams °Organization         Address  Phone  Notes  °[facility], Mobile Crisis Care Unit  1-877-626-1772   °Assertive °[facility] ° [address]  [place], [place] 336-834-9664   °Sharon DeEsch [address] °[place] 336-554-5454   ° °Self-Help/Support Groups °Organization         Address  Phone             Notes  °[facility]  - variety of support groups  336- 373-1402 Call for more information  °Narcotics Anonymous (NA), [facility] [address], °[place]  2 meetings at this location  ° °  Residential Treatment Programs °Organization         Address  Phone  Notes  °[facility] [address],    °[place]  1-866-801-8205   °[facility] ° [address], [place], [place] 704-293-8524   °[facility] [address], [place] 336-845-3988 Admissions: 8am-3pm M-F  °[facility] [address]    °[place], [place] 336-841-1104   °[facility] [address], [place], [place] 336-379-7146   °[facility] [address]  °[place], [place] 336-285-9073   °[facility] - Intensive Outpatient [address], [place], [place] 336-852-3033   °[facility] [address]  °[place], [place] 1-877-615-2722 or 336-784-9470   °[facility] [address] [place], [place] 336-227-7417 Accepts Medicaid  °[facility] [address]  °[place] 1-800-659-3381 Substance Abuse/Addiction Treatment  ° °[place] Behavioral Health Resources °Organization         Address  Phone  Notes  °[facility]  (888) 581-9988   °Julie Brannon, PhD [address] [place], [place]   (336) 349-5553 or (336) 951-0000   °[facility]   [address] °[place], [place] (336) 349-4454   °[facility] [address], [place], [place] (336) 342-8316 Insurance/Medicaid/sponsorship through [facility]  °[facility] [address]                                    [place], [place] (336) 342-8316 Therapy/tele-psych/case    °[facility] [address]  ° [place], [place] (336) 349-2233    °Dr. Arfeen  (336) 349-4544   °[facility] [place]  [employer] [place] [facility] 1) [address], [place] °2) [address], [place] °3)  [address], [place] (336) 349-3220 °(336) 342-7768 ° °(336) 342-8140   °[place] [facility] (336) 342-1394 or (336) 342-3537 (After Hours)    ° ° °Avoid avoid caffinated products, such as teas, colas, coffee, chocolate. Avoid over the counter cold medicines, herbal or "natural vitamin" products, and illicit drugs because they can contain stimulants.  Call your regular medical doctor and the Cardiologist on Monday to schedule a follow up appointment this week.  Return to the Emergency Department immediately if worsening. ° °

## 2017-04-14 IMAGING — DX DG CHEST 2V
2 series · 2 of 2 positions shown · non-contrast
Comparison: None.

CLINICAL DATA: Left side chest pain, palpitations

EXAM:
CHEST  2 VIEW

[chest pa]
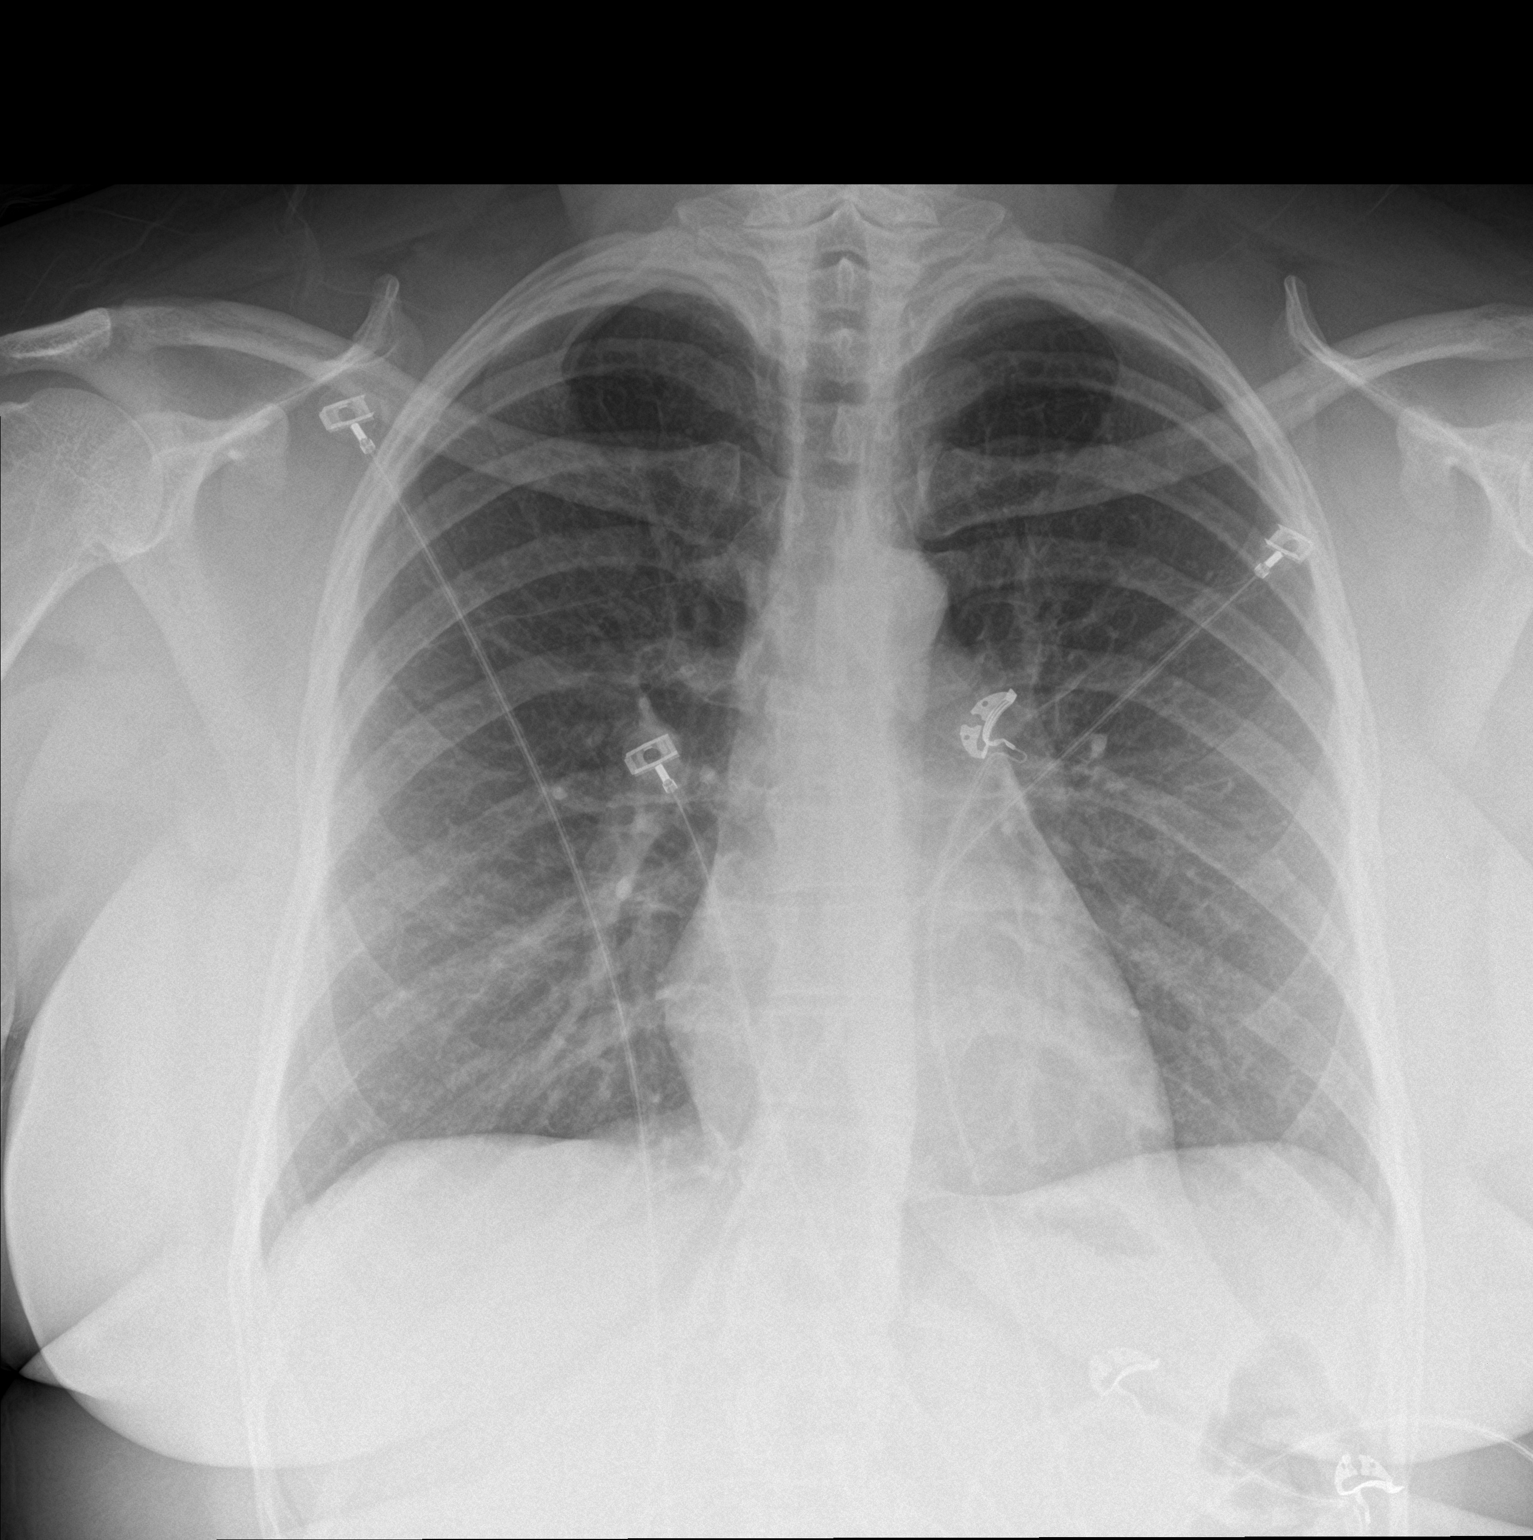

[chest lat]
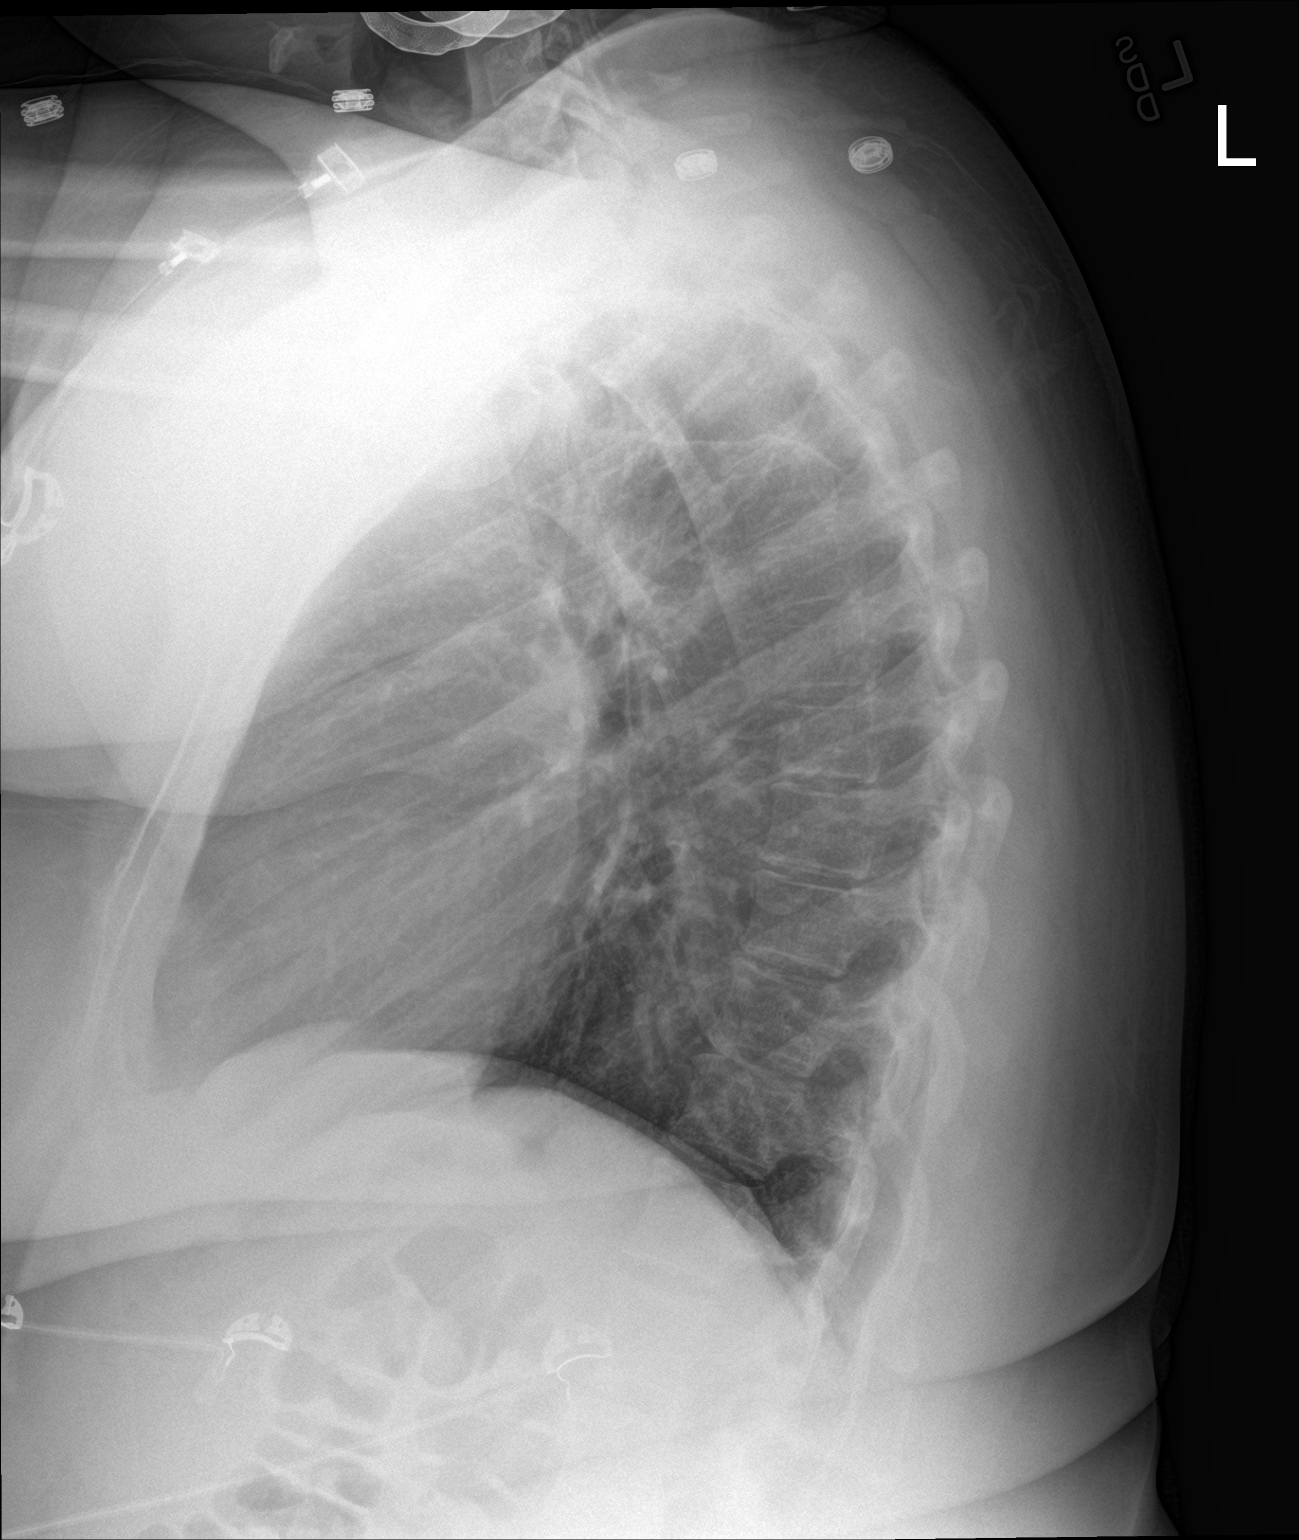

[2 of 2 positions shown; findings below may reference images not displayed]

FINDINGS: The heart size and mediastinal contours are within normal limits.
Both lungs are clear. The visualized skeletal structures are
unremarkable.
IMPRESSION: No active cardiopulmonary disease.

## 2018-04-04 ENCOUNTER — Encounter: Payer: Self-pay | Admitting: Internal Medicine

## 2020-01-21 ENCOUNTER — Other Ambulatory Visit: Payer: Self-pay

## 2020-01-21 ENCOUNTER — Encounter: Payer: Self-pay | Admitting: Emergency Medicine

## 2020-01-21 ENCOUNTER — Ambulatory Visit
Admission: EM | Admit: 2020-01-21 | Discharge: 2020-01-21 | Disposition: A | Discharge status: Home / Self Care | Payer: Commercial Managed Care - PPO

## 2020-01-21 DIAGNOSIS — M7918 Myalgia, other site: Secondary | ICD-10-CM

## 2020-01-21 NOTE — Discharge Instructions (Signed)

## 2020-01-21 NOTE — ED Provider Notes (Signed)
[facility]    CSN: [id number] Arrival date & time: 01/21/20  0936      History   Chief Complaint Chief Complaint  Patient presents with  . Arm Pain    HPI Kendra Ruiz is a 39 y.o. female with history of GERD, headaches, hypertension, allergies presenting for right arm pain.  States is been ongoing for the last 2 weeks, has not improved with OTC management including 40 mg ibuprofen, Aleve.  Patient having difficulty opening jars.  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  Denies trauma/inciting event.  No neuropathy, neck pain, fever.   Past Medical History:  Diagnosis Date  . Abdominal cramping   . Allergic rhinitis   . Anxiety   . Cough   . GERD (gastroesophageal reflux disease)   . Headache   . Hypertension     Patient Active Problem List   Diagnosis Date Noted  . Overweight 02/19/2014  . Acute sinusitis 01/22/2014  . Tobacco use disorder 01/22/2014  . Routine general medical examination at a health care facility 05/14/2013  . Back pain 05/14/2013  . Hyperglycemia 05/14/2013  . Depression 04/03/2013  . Insomnia 04/03/2013  . Migraine 04/03/2013  . Allergic rhinitis 04/03/2013    Past Surgical History:  Procedure Laterality Date  . CHOLECYSTECTOMY  2013    OB History   No obstetric history on file.      Home Medications    Prior to Admission medications   Medication Sig Start Date End Date Taking? Authorizing Provider  valsartan (DIOVAN) 40 MG tablet Take 40 mg by mouth daily.   Yes [provider]  aspirin-acetaminophen-caffeine (EXCEDRIN MIGRAINE) [phone] MG per tablet Take 2 tablets by mouth daily as needed. For headache.    [provider]  buPROPion (WELLBUTRIN SR) 150 MG 12 hr tablet Take one tablet daily for 3 days, then one tablet twice a day from 08/30/14 Patient not taking: Reported on 05/09/2015 08/26/14   Oneal Grout, MD  hydrochlorothiazide (HYDRODIURIL) 25 MG tablet Take 1 tablet (25 mg total) by  mouth daily. 08/26/14   Oneal Grout, MD  Multiple Vitamins-Minerals (MULTIVITAMINS THER. W/MINERALS) TABS Take 1 tablet by mouth daily.    [provider]  omeprazole (PRILOSEC) 20 MG capsule Take one tablet twice a day for 1 week and then once a day with meals for heartburn 08/26/14   Oneal Grout, MD  temazepam (RESTORIL) 7.5 MG capsule Take 7.5 mg by mouth at bedtime as needed for sleep.    [provider]    Family History Family History  Problem Relation Age of Onset  . Diabetes Sister   . Hypertension Sister   . Bipolar disorder Sister   . Bipolar disorder Sister   . Obesity Sister     Social History Social History   Tobacco Use  . Smoking status: Current Every Day Smoker    Packs/day: 0.50    Years: 10.00    Pack years: 5.00    Types: Cigarettes  . Smokeless tobacco: Never Used  Substance Use Topics  . Alcohol use: Yes    Comment: occasionally liquor  . Drug use: No     Allergies   Patient has no known allergies.   Review of Systems As per HPI   Physical Exam Triage Vital Signs ED Triage Vitals  Enc Vitals Group     BP      Pulse      Resp      Temp  Temp src      SpO2      Weight      Height      Head Circumference      Peak Flow      Pain Score      Pain Loc      Pain Edu?      Excl. in GC?    No data found.  Updated Vital Signs BP 121/89 (BP Location: Left Arm)   Pulse (!) 110   Temp 98.3 F (36.8 C) (Oral)   Resp 16   SpO2 96%   Visual Acuity Right Eye Distance:   Left Eye Distance:   Bilateral Distance:    Right Eye Near:   Left Eye Near:    Bilateral Near:     Physical Exam Constitutional:      General: She is not in acute distress. HENT:     Head: Normocephalic and atraumatic.  Eyes:     General: No scleral icterus.    Pupils: Pupils are equal, round, and reactive to light.  Cardiovascular:     Rate and Rhythm: Normal rate.  Pulmonary:     Effort: Pulmonary effort is normal.   Musculoskeletal:        General: Tenderness present. No swelling or deformity. Normal range of motion.     Comments: TTP without crepitus over right brachial radialis origin  Skin:    Capillary Refill: Capillary refill takes less than 2 seconds.     Coloration: Skin is not jaundiced or pale.  Neurological:     General: No focal deficit present.     Mental Status: She is alert and oriented to person, place, and time.     Sensory: No sensory deficit.     Deep Tendon Reflexes: Reflexes normal.      UC Treatments / Results  Labs (all labs ordered are listed, but only abnormal results are displayed) Labs Reviewed - No data to display  EKG   Radiology No results found.  Procedures Procedures (including critical care time)  Medications Ordered in UC Medications - No data to display  Initial Impression / Assessment and Plan / UC Course  I have reviewed the triage vital signs and the nursing notes.  Pertinent labs & imaging results that were available during my care of the patient were reviewed by me and considered in my medical decision making (see chart for details).     H&P consistent with overuse of brachial radialis, right arm.  Reviewed supportive therapy as outlined below.  Ace wrap applied in office with patient tolerated well.  Work note provided.  Return precautions discussed, patient verbalized understanding and is agreeable to plan. Final Clinical Impressions(s) / UC Diagnoses   Final diagnoses:  Brachioradialis muscle tenderness     Discharge Instructions     Recommend RICE: rest, ice, compression, elevation as needed for pain.    Heat therapy (hot compress, warm wash rag, hot showers, etc.) can help relax muscles and soothe muscle aches. Cold therapy (ice packs) can be used to help swelling both after injury and after prolonged use of areas of chronic pain/aches.  For pain: recommend 350 mg-1000 mg of Tylenol (acetaminophen) and/or 200 mg - 800 mg of Advil  (ibuprofen, Motrin) every 8 hours as needed.  May alternate between the two throughout the day as they are generally safe to take together.  DO NOT exceed more than 3000 mg of Tylenol or 3200 mg of ibuprofen in a 24 hour period  as this could damage your stomach, kidneys, liver, or increase your bleeding risk.    ED Prescriptions    None     I have reviewed the PDMP during this encounter.   Hall[place], [place], [place] 01/21/20 1002

## 2020-01-21 NOTE — ED Triage Notes (Signed)
Pt sts right arm pain into forearm x 2 weeks that is not improving; pt sts unable to open jars etc; denies obvious injury

## 2020-12-26 ENCOUNTER — Encounter (HOSPITAL_COMMUNITY): Payer: Self-pay

## 2020-12-26 ENCOUNTER — Ambulatory Visit (HOSPITAL_COMMUNITY)
Admission: EM | Admit: 2020-12-26 | Discharge: 2020-12-26 | Disposition: A | Discharge status: Home / Self Care | Payer: Commercial Managed Care - PPO | Attending: Emergency Medicine | Admitting: Emergency Medicine

## 2020-12-26 ENCOUNTER — Other Ambulatory Visit: Payer: Self-pay

## 2020-12-26 DIAGNOSIS — R42 Dizziness and giddiness: Secondary | ICD-10-CM

## 2020-12-26 DIAGNOSIS — I10 Essential (primary) hypertension: Secondary | ICD-10-CM | POA: Diagnosis not present

## 2020-12-26 DIAGNOSIS — R519 Headache, unspecified: Secondary | ICD-10-CM | POA: Diagnosis not present

## 2020-12-26 NOTE — Discharge Instructions (Addendum)
Go to the emergency department if you have acute dizziness or other concerning symptoms.  Your blood pressure is elevated today at 146/96.  Please have this rechecked by your primary care provider in 2-4 weeks.

## 2020-12-26 NOTE — ED Triage Notes (Signed)
Pt c/o headache, HBP, dizziness, pain on the right side of her neck and arm x this morning. Pt states she took her BP medication around 0730.

## 2020-12-26 NOTE — ED Provider Notes (Signed)
[facility]    CSN: [id number] Arrival date & time: 12/26/20  1002      History   Chief Complaint Chief Complaint  Patient presents with  . Headache  . Dizziness  . Neck Pain  . Arm Pain    right    HPI Kendra Ruiz is a 40 y.o. female.   Patient presents with headache, dizziness, right side neck pain, right arm pain and elevated blood pressure since this morning.  She states her symptoms started while she was at work and there was a stressful situation.  She took her blood pressure at the time and it was 152/95.  She describes the dizziness as feeling like she was going to "fall out"; no sensation of room spinning.  She denies numbness, weakness, chest pain, shortness of breath, abdominal pain, or other symptoms.  No treatments attempted.  Her medical history includes hypertension, anxiety, depression, insomnia, migraine headaches, allergic rhinitis, hyperglycemia, tobacco use disorder, obesity.  The history is provided by the patient and medical records.    Past Medical History:  Diagnosis Date  . Abdominal cramping   . Allergic rhinitis   . Anxiety   . Cough   . GERD (gastroesophageal reflux disease)   . Headache   . Hypertension     Patient Active Problem List   Diagnosis Date Noted  . Overweight 02/19/2014  . Acute sinusitis 01/22/2014  . Tobacco use disorder 01/22/2014  . Routine general medical examination at a health care facility 05/14/2013  . Back pain 05/14/2013  . Hyperglycemia 05/14/2013  . Depression 04/03/2013  . Insomnia 04/03/2013  . Migraine 04/03/2013  . Allergic rhinitis 04/03/2013    Past Surgical History:  Procedure Laterality Date  . CHOLECYSTECTOMY  2013    OB History   No obstetric history on file.      Home Medications    Prior to Admission medications   Medication Sig Start Date End Date Taking? Authorizing Provider  hydrochlorothiazide (HYDRODIURIL) 25 MG tablet Take 1 tablet (25 mg total) by mouth daily.  08/26/14  Yes Oneal Grout, MD  valsartan (DIOVAN) 40 MG tablet Take 40 mg by mouth daily.   Yes [provider]  aspirin-acetaminophen-caffeine (EXCEDRIN MIGRAINE) [phone] MG per tablet Take 2 tablets by mouth daily as needed. For headache.    [provider]  buPROPion (WELLBUTRIN SR) 150 MG 12 hr tablet Take one tablet daily for 3 days, then one tablet twice a day from 08/30/14 Patient not taking: Reported on 05/09/2015 08/26/14   Oneal Grout, MD  Multiple Vitamins-Minerals (MULTIVITAMINS THER. W/MINERALS) TABS Take 1 tablet by mouth daily.    [provider]  omeprazole (PRILOSEC) 20 MG capsule Take one tablet twice a day for 1 week and then once a day with meals for heartburn 08/26/14   Oneal Grout, MD  temazepam (RESTORIL) 7.5 MG capsule Take 7.5 mg by mouth at bedtime as needed for sleep.    [provider]    Family History Family History  Problem Relation Age of Onset  . Diabetes Sister   . Hypertension Sister   . Bipolar disorder Sister   . Bipolar disorder Sister   . Obesity Sister     Social History Social History   Tobacco Use  . Smoking status: Current Every Day Smoker    Packs/day: 0.50    Years: 10.00    Pack years: 5.00    Types: Cigarettes  . Smokeless tobacco: Never Used  Substance Use Topics  .  Alcohol use: Yes    Comment: occasionally liquor  . Drug use: No     Allergies   Patient has no known allergies.   Review of Systems Review of Systems  Constitutional: Negative for chills and fever.  HENT: Negative for ear pain and sore throat.   Respiratory: Negative for cough and shortness of breath.   Cardiovascular: Negative for chest pain and palpitations.  Gastrointestinal: Negative for abdominal pain and vomiting.  Musculoskeletal: Positive for myalgias and neck pain. Negative for arthralgias, back pain, gait problem and joint swelling.  Skin: Negative for color change and rash.  Neurological: Positive for  dizziness and headaches. Negative for tremors, seizures, syncope, facial asymmetry, speech difficulty, weakness and numbness.  All other systems reviewed and are negative.    Physical Exam Triage Vital Signs ED Triage Vitals  Enc Vitals Group     BP      Pulse      Resp      Temp      Temp src      SpO2      Weight      Height      Head Circumference      Peak Flow      Pain Score      Pain Loc      Pain Edu?      Excl. in GC?    No data found.  Updated Vital Signs BP (!) 146/96 (BP Location: Right Arm)   Pulse (!) 103   Temp 99 F (37.2 C) (Oral)   Resp 20   LMP  (LMP Unknown)   SpO2 100%   Visual Acuity Right Eye Distance:   Left Eye Distance:   Bilateral Distance:    Right Eye Near:   Left Eye Near:    Bilateral Near:     Physical Exam Vitals and nursing note reviewed.  Constitutional:      General: She is not in acute distress.    Appearance: She is well-developed. She is obese. She is not ill-appearing.  HENT:     Head: Normocephalic and atraumatic.     Mouth/Throat:     Mouth: Mucous membranes are moist.  Eyes:     Conjunctiva/sclera: Conjunctivae normal.  Cardiovascular:     Rate and Rhythm: Normal rate and regular rhythm.     Heart sounds: Normal heart sounds.  Pulmonary:     Effort: Pulmonary effort is normal. No respiratory distress.     Breath sounds: Normal breath sounds.  Abdominal:     Palpations: Abdomen is soft.     Tenderness: There is no abdominal tenderness.  Musculoskeletal:     Cervical back: Neck supple.     Right lower leg: No edema.     Left lower leg: No edema.  Skin:    General: Skin is warm and dry.  Neurological:     General: No focal deficit present.     Mental Status: She is alert and oriented to person, place, and time.     Sensory: No sensory deficit.     Motor: No weakness.     Gait: Gait normal.  Psychiatric:        Mood and Affect: Mood normal.        Behavior: Behavior normal.      UC Treatments /  Results  Labs (all labs ordered are listed, but only abnormal results are displayed) Labs Reviewed - No data to display  EKG   Radiology No results  found.  Procedures Procedures (including critical care time)  Medications Ordered in UC Medications - No data to display  Initial Impression / Assessment and Plan / UC Course  I have reviewed the triage vital signs and the nursing notes.  Pertinent labs & imaging results that were available during my care of the patient were reviewed by me and considered in my medical decision making (see chart for details).   Dizziness, elevated blood pressure with known hypertension, acute headache.  Patient is well-appearing and her exam is reassuring.  EKG shows sinus rhythm, rate 99, no ST elevation, compared to previous from 2016.  Discussed limitations of being seen in an urgent care setting.  Strict ED precautions discussed; instructed patient to go to the ED if she has acute dizziness or other concerning symptoms.  Discussed that her blood pressure is elevated today and needs to be rechecked by her PCP in 2 to 4 weeks.  Education provided on managing hypertension.  She agrees to plan of care.   Final Clinical Impressions(s) / UC Diagnoses   Final diagnoses:  Dizziness  Elevated blood pressure reading in office with diagnosis of hypertension  Acute nonintractable headache, unspecified headache type     Discharge Instructions     Go to the emergency department if you have acute dizziness or other concerning symptoms.  Your blood pressure is elevated today at 146/96.  Please have this rechecked by your primary care provider in 2-4 weeks.         ED Prescriptions    None     PDMP not reviewed this encounter.   Mickie Bail, NP 12/26/20 1037

## 2021-05-02 ENCOUNTER — Telehealth: Payer: Commercial Managed Care - PPO | Admitting: Nurse Practitioner

## 2021-05-02 DIAGNOSIS — R109 Unspecified abdominal pain: Secondary | ICD-10-CM

## 2021-05-02 DIAGNOSIS — R197 Diarrhea, unspecified: Secondary | ICD-10-CM

## 2021-05-02 NOTE — Progress Notes (Signed)
°  Based on what you shared with me, I feel your condition warrants further evaluation and I recommend that you be seen in a face to face visit. °  °NOTE: There will be NO CHARGE for this eVisit °  °If you are having a true medical emergency please call 911.   °  ° For an urgent face to face visit, [facility] has six urgent care centers for your convenience:  °  ° [facility] [place] °Get Driving Directions °[phone] °[address] °[place], [place] [address] °  ° [facility] ([place]) °Get Driving Directions °[phone] °[address] °[place], [place] [address] ° °[facility] ([place] [facility] [place]) °Get Driving Directions °[phone] °[address] °[place],  [place]  [address] ° °[facility] °Get Driving Directions °[phone] °[address] °[place], [place] [address] °  °[facility] [place] °Get Driving Directions  °[phone] °[address] °[address] °[place], [place] [address] °  ° [facility] [place] °Get Driving Directions °[phone] °[address] °[place], [place] [address] ° °Your MyChart E-visit questionnaire answers were reviewed by a board certified advanced clinical practitioner to complete your personal care plan based on your specific symptoms.  Thank you for using e-Visits. °

## 2021-05-14 ENCOUNTER — Ambulatory Visit
Admission: EM | Admit: 2021-05-14 | Discharge: 2021-05-14 | Disposition: A | Discharge status: Home / Self Care | Payer: Commercial Managed Care - PPO | Attending: Internal Medicine | Admitting: Internal Medicine

## 2021-05-14 ENCOUNTER — Other Ambulatory Visit: Payer: Self-pay

## 2021-05-14 DIAGNOSIS — L0231 Cutaneous abscess of buttock: Secondary | ICD-10-CM

## 2021-05-14 MED ORDER — DOXYCYCLINE HYCLATE 100 MG PO CAPS: 100.0000 mg | ORAL_CAPSULE | Freq: Two times a day (BID) | ORAL | 0 refills | Status: DC

## 2021-05-14 NOTE — ED Provider Notes (Signed)
[facility]    CSN: [id number] Arrival date & time: 05/14/21  1012      History   Chief Complaint Chief Complaint  Patient presents with   Abscess    HPI Kendra Ruiz is a 40 y.o. female.   Patient presents with painful lesion present to right buttocks that has been present for approximately 2 weeks.  Patient report bloody drainage from that area but no purulent drainage.  Denies Kendra known fevers.  Denies numbness or tingling or saddle anesthesia to the area.  Having normal bowel movements.   Abscess  Past Medical History:  Diagnosis Date   Abdominal cramping    Allergic rhinitis    Anxiety    Cough    GERD (gastroesophageal reflux disease)    Headache    Hypertension     Patient Active Problem List   Diagnosis Date Noted   Overweight 02/19/2014   Acute sinusitis 01/22/2014   Tobacco use disorder 01/22/2014   Routine general medical examination at a health care facility 05/14/2013   Back pain 05/14/2013   Hyperglycemia 05/14/2013   Depression 04/03/2013   Insomnia 04/03/2013   Migraine 04/03/2013   Allergic rhinitis 04/03/2013    Past Surgical History:  Procedure Laterality Date   CHOLECYSTECTOMY  2013    OB History   No obstetric history on file.      Home Medications    Prior to Admission medications   Medication Sig Start Date End Date Taking? Authorizing Provider  doxycycline (VIBRAMYCIN) 100 MG capsule Take 1 capsule (100 mg total) by mouth 2 (two) times daily. 05/14/21  Yes Lance Muss, FNP  aspirin-acetaminophen-caffeine (EXCEDRIN MIGRAINE) [phone] MG per tablet Take 2 tablets by mouth daily as needed. For headache.    [provider]  buPROPion (WELLBUTRIN SR) 150 MG 12 hr tablet Take one tablet daily for 3 days, then one tablet twice a day from 08/30/14 Patient not taking: Reported on 05/09/2015 08/26/14   Oneal Grout, MD  hydrochlorothiazide (HYDRODIURIL) 25 MG tablet Take 1 tablet (25 mg total) by mouth daily.  08/26/14   Oneal Grout, MD  Multiple Vitamins-Minerals (MULTIVITAMINS THER. W/MINERALS) TABS Take 1 tablet by mouth daily.    [provider]  omeprazole (PRILOSEC) 20 MG capsule Take one tablet twice a day for 1 week and then once a day with meals for heartburn 08/26/14   Oneal Grout, MD  temazepam (RESTORIL) 7.5 MG capsule Take 7.5 mg by mouth at bedtime as needed for sleep.    [provider]  valsartan (DIOVAN) 40 MG tablet Take 40 mg by mouth daily.    [provider]    Family History Family History  Problem Relation Age of Onset   Diabetes Sister    Hypertension Sister    Bipolar disorder Sister    Bipolar disorder Sister    Obesity Sister     Social History Social History   Tobacco Use   Smoking status: Every Day    Packs/day: 0.50    Years: 10.00    Pack years: 5.00    Types: Cigarettes   Smokeless tobacco: Never  Substance Use Topics   Alcohol use: Yes    Comment: occasionally liquor   Drug use: No     Allergies   Patient has no known allergies.   Review of Systems Review of Systems Per HPI  Physical Exam Triage Vital Signs ED Triage Vitals  Enc Vitals Group     BP 05/14/21 1103 105/73  Pulse Rate 05/14/21 1103 97     Resp 05/14/21 1103 20     Temp 05/14/21 1103 98.5 F (36.9 C)     Temp Source 05/14/21 1103 Oral     SpO2 05/14/21 1103 96 %     Weight --      Height --      Head Circumference --      Peak Flow --      Pain Score 05/14/21 1111 0     Pain Loc --      Pain Edu? --      Excl. in GC? --    No data found.  Updated Vital Signs BP 105/73 (BP Location: Left Arm)   Pulse 97   Temp 98.5 F (36.9 C) (Oral)   Resp 20   SpO2 96%   Visual Acuity Right Eye Distance:   Left Eye Distance:   Bilateral Distance:    Right Eye Near:   Left Eye Near:    Bilateral Near:     Physical Exam Exam conducted with a chaperone present.  Constitutional:      Appearance: Normal appearance.  HENT:     Head:  Normocephalic and atraumatic.  Eyes:     Extraocular Movements: Extraocular movements intact.     Conjunctiva/sclera: Conjunctivae normal.  Pulmonary:     Effort: Pulmonary effort is normal.  Skin:    General: Skin is warm and dry.     Findings: Abscess present.     Comments: Flat, hard, and indurated area that is approximately 4 cm x 2 cm located to right inner buttocks and between cheeks of buttocks.  Neurological:     General: No focal deficit present.     Mental Status: She is alert and oriented to person, place, and time. Mental status is at baseline.  Psychiatric:        Mood and Affect: Mood normal.        Behavior: Behavior normal.        Thought Content: Thought content normal.        Judgment: Judgment normal.     UC Treatments / Results  Labs (all labs ordered are listed, but only abnormal results are displayed) Labs Reviewed - No data to display  EKG   Radiology No results found.  Procedures Procedures (including critical care time)  Medications Ordered in UC Medications - No data to display  Initial Impression / Assessment and Plan / UC Course  I have reviewed the triage vital signs and the nursing notes.  Pertinent labs & imaging results that were available during my care of the patient were reviewed by me and considered in my medical decision making (see chart for details).     I&D is not indicated at this time due to induration of abscess as well as location of abscess.  Doxycycline x10 days.  Advised patient to use warm compresses or warm Epson salt soaks to help soften abscess.  Patient to follow-up with primary care or urgent care if abscess does not improve.  Advised patient  to go to the hospital if abscess worsens.  No red flags seen on exam.  Discussed strict return precautions. Patient verbalized understanding and is agreeable with plan.  Final Clinical Impressions(s) / UC Diagnoses   Final diagnoses:  Abscess of buttock     Discharge  Instructions      You have been prescribed doxycycline antibiotic to take due to infection of buttocks.  Please take with food.  Please also use warm compresses or warm Epson salt soaks.  Follow-up with primary care if symptoms persist.  Go to the hospital if symptoms worsen.     ED Prescriptions     Medication Sig Dispense Auth. Provider   doxycycline (VIBRAMYCIN) 100 MG capsule Take 1 capsule (100 mg total) by mouth 2 (two) times daily. 20 capsule Lance Muss, FNP      PDMP not reviewed this encounter.   Lance Muss, FNP 05/14/21 1149

## 2021-05-14 NOTE — Discharge Instructions (Addendum)
You have been prescribed doxycycline antibiotic to take due to infection of buttocks.  Please take with food.  Please also use warm compresses or warm Epson salt soaks.  Follow-up with primary care if symptoms persist.  Go to the hospital if symptoms worsen.

## 2021-05-14 NOTE — ED Triage Notes (Signed)
Pt c/o abscess to right inner lower buttocks. First noticed about 2 weeks ago. States it bled one day but not since. States it is harder over time.

## 2022-11-16 ENCOUNTER — Ambulatory Visit: Payer: BC Managed Care – PPO | Admitting: Internal Medicine

## 2022-11-16 ENCOUNTER — Encounter: Payer: Self-pay | Admitting: Internal Medicine

## 2022-11-16 VITALS — BP 130/86 | HR 112 | Temp 98.3°F | Resp 18 | Ht 65.0 in | Wt 257.2 lb

## 2022-11-16 DIAGNOSIS — R0683 Snoring: Secondary | ICD-10-CM | POA: Diagnosis not present

## 2022-11-16 DIAGNOSIS — Z6841 Body Mass Index (BMI) 40.0 and over, adult: Secondary | ICD-10-CM

## 2022-11-16 DIAGNOSIS — I10 Essential (primary) hypertension: Secondary | ICD-10-CM | POA: Diagnosis not present

## 2022-11-16 DIAGNOSIS — F411 Generalized anxiety disorder: Secondary | ICD-10-CM

## 2022-11-16 DIAGNOSIS — E119 Type 2 diabetes mellitus without complications: Secondary | ICD-10-CM | POA: Diagnosis not present

## 2022-11-16 MED ORDER — VALSARTAN 80 MG PO TABS: 80.0000 mg | ORAL_TABLET | Freq: Every day | ORAL | 3 refills | Status: DC

## 2022-11-16 MED ORDER — MOUNJARO 2.5 MG/0.5ML ~~LOC~~ SOAJ: 2.5000 mg | SUBCUTANEOUS | 0 refills | Status: DC

## 2022-11-16 MED ORDER — CLONAZEPAM 0.5 MG PO TABS: 0.5000 mg | ORAL_TABLET | Freq: Three times a day (TID) | ORAL | 0 refills | Status: AC | PRN

## 2022-11-16 MED ORDER — MOUNJARO 7.5 MG/0.5ML ~~LOC~~ SOAJ: 7.5000 mg | SUBCUTANEOUS | 0 refills | Status: DC

## 2022-11-16 MED ORDER — MOUNJARO 5 MG/0.5ML ~~LOC~~ SOAJ: 5.0000 mg | SUBCUTANEOUS | 0 refills | Status: DC

## 2022-11-16 NOTE — Assessment & Plan Note (Signed)
She does not want to be on a maintenance medication for her anxiety.  We had a discussion about being able to decompress.  She can use her klonopin as needed.

## 2022-11-16 NOTE — Assessment & Plan Note (Signed)
She has morbid obesity associated with T2 diabetes and HTN.  I have started her on mounjaro.  I want her to eat heatlhy, exercise and lose weight.

## 2022-11-16 NOTE — Assessment & Plan Note (Signed)
Her BP is doing well and is controlled.  We will continue her diovan and HCTZ.

## 2022-11-16 NOTE — Assessment & Plan Note (Signed)
Plan as above.  

## 2022-11-16 NOTE — Progress Notes (Signed)
Office Visit  Subjective   Patient ID: Murray Voda   DOB: 06-06-1981   Age: 42 y.o.   MRN: [id number]   Chief Complaint Chief Complaint  Patient presents with   New Patient (Initial Visit)     History of Present Illness The patient is a 42 year old female who presents today to establish care.  She has not had a PCP in about 2 years.  She comes in today for a follow-up visit for her T2 diabetes.  She was diagnosed with T2 diabetes in 2015/12/15.  Today, she states she takes her metformin intermittently.  She states she has abdominal pain from her metformin ER but denies any nausea, vomiting or diarrhea.  She is currently on:  Metformin ER 1000mg  daily.  She is not exercising as much as they would like.. She specifically denies unexplained nausea or vomiting or hypoglycemia. She does not routinely check blood sugars. She has not had a HgBA1c done in a while.  She came in fasting today in anticipation of lab work. She has no long term complications of diabetic retinopathy, nephropathy, neuropathy or cardiovascular disease.  Her last eye exam was a few months ago and she was told she does not have any diabetic changes..  The patient is a 42 year old female who presents for a follow-up evaluation of hypertension.  She was diagnosed with HTN in Dec 14, 2012.  The patient has been checking her blood pressure at home. The patient's blood pressure has ranged systollically [id number]. The patient's current medications include: diovan 80mg  daily and HCTZ 25mg  daily. The patient has been tolerating her medications well. The patient denies any headache, visual changes, dizziness, lightheadness, chest pain, shortness of breath, weakness/numbness, and edema.   The patient is a 42 year old female who returns for followup of her anxiety/stress but also has a history of depression.  She was diagnosed with depression by her PCP in Dec 15, 2014 where they started her on bupropion at that time.  She states her husband died in 15-Dec-2018 and  she has been more anxious/stress at that time.  Today, she denies any depression but states her anxiety is moderate.  She has not had a panic attack in a "long time".  The patient stopped bupropion right after she started it as she was afraid of side effects.  She is currently on clonazepam 0.5mg  po TID prn which she first started in 12/15/2014.  Today, she states she uses it maybe 3-4 times per month.  She does report some insomnia where she has no problems getting to sleep but she does wake up.  She states she does snore.  She denies any difficulty performing routine daily activities, extreme feelings of guilt, feelings of isolation, loss of interest in pleasurable activities, difficulty concentrating, fatigue, feelings of worthlessness, helpless feeling, suicidal ideation, homicidal ideation, weight loss, loss of appetite, social withdrawal, and out of control feelings.  She states she feels overwhelmed at times.   This patient feels that she is able to care for herself. She currently lives with her son. She has never been admitted to a psychiatric facility. She has never seen a psychiatrist or a therapist in the past.      Past Medical History Past Medical History:  Diagnosis Date   Allergic rhinitis    Anxiety    GERD (gastroesophageal reflux disease)    Headache    Hypertension    T2DM (type 2 diabetes mellitus) ([place])      Allergies Allergies  Allergen Reactions   Bactrim [Sulfamethoxazole-Trimethoprim] Diarrhea   Keflex [Cephalexin] Rash     Medications  Current Outpatient Medications:    hydrochlorothiazide (HYDRODIURIL) 25 MG tablet, Take 1 tablet (25 mg total) by mouth daily., Disp: 90 tablet, Rfl: 3   Multiple Vitamins-Minerals (MULTIVITAMINS THER. W/MINERALS) TABS, Take 1 tablet by mouth daily., Disp: , Rfl:    omeprazole (PRILOSEC) 20 MG capsule, Take one tablet twice a day for 1 week and then once a day with meals for heartburn, Disp: 30 capsule, Rfl: 3   Review of  Systems Review of Systems  Constitutional:  Negative for chills and fever.  Eyes:  Negative for blurred vision and double vision.  Respiratory:  Negative for cough, shortness of breath and wheezing.   Cardiovascular:  Negative for chest pain, palpitations and leg swelling.  Gastrointestinal:  Negative for abdominal pain, constipation, diarrhea, nausea and vomiting.  Genitourinary:  Negative for frequency and hematuria.  Musculoskeletal:  Negative for myalgias.  Skin:  Negative for itching and rash.  Neurological:  Negative for dizziness, weakness and headaches.  Psychiatric/Behavioral:  Negative for depression. The patient is nervous/anxious.        Objective:    Vitals BP 130/86 (BP Location: Left Arm, Patient Position: Sitting)   Pulse (!) 112   Temp 98.3 F (36.8 C)   Resp 18   Ht 5\' 5"  (1.651 m)   Wt 257 lb 4 oz (116.7 kg)   SpO2 97%   BMI 42.81 kg/m    Physical Examination Physical Exam     Assessment & Plan:   Essential hypertension Her BP is doing well and is controlled.  We will continue her diovan and HCTZ.  T2 Diabetes mellitus without complication ([place]) I want her to stop the metformin ER and we will try to get her on mounjaro for her diabetes and for weight loss.  I want her to exercise, eat healthy and lose weight.  Her diabetic foot exam was normal today.  We will obtain urine studies and a HgBA1c with goal <7%.  Snoring I will set her up for an overnight pulse oximetry test.  Morbid obesity ([place]) She has morbid obesity associated with T2 diabetes and HTN.  I have started her on mounjaro.  I want her to eat heatlhy, exercise and lose weight.  GAD (generalized anxiety disorder) She does not want to be on a maintenance medication for her anxiety.  We had a discussion about being able to decompress.  She can use her klonopin as needed.  BMI 40.0-44.9, adult [facility]) Plan as above.    Return in about 3 months (around 02/16/2023).   Townsend Roger, MD

## 2022-11-16 NOTE — Assessment & Plan Note (Signed)
I want her to stop the metformin ER and we will try to get her on mounjaro for her diabetes and for weight loss.  I want her to exercise, eat healthy and lose weight.  Her diabetic foot exam was normal today.  We will obtain urine studies and a HgBA1c with goal <7%.

## 2022-11-16 NOTE — Assessment & Plan Note (Signed)
I will set her up for an overnight pulse oximetry test.

## 2022-11-17 LAB — CMP14 + ANION GAP
ALT: 14 IU/L (ref 0–32)
AST: 17 IU/L (ref 0–40)
Albumin/Globulin Ratio: 1.3 (ref 1.2–2.2)
Albumin: 4 g/dL (ref 3.9–4.9)
Alkaline Phosphatase: 91 IU/L (ref 44–121)
Anion Gap: 15 mmol/L (ref 10.0–18.0)
BUN/Creatinine Ratio: 17 (ref 9–23)
BUN: 10 mg/dL (ref 6–24)
Bilirubin Total: <0.2 mg/dL (ref 0.0–1.2)
CO2: 20 mmol/L (ref 20–29)
Calcium: 9.3 mg/dL (ref 8.7–10.2)
Chloride: 107 mmol/L — ABNORMAL HIGH (ref 96–106)
Creatinine, Ser: 0.59 mg/dL (ref 0.57–1.00)
Globulin, Total: 3 g/dL (ref 1.5–4.5)
Glucose: 67 mg/dL — ABNORMAL LOW (ref 70–99)
Potassium: 4.4 mmol/L (ref 3.5–5.2)
Sodium: 142 mmol/L (ref 134–144)
Total Protein: 7 g/dL (ref 6.0–8.5)
eGFR: 115 mL/min/{1.73_m2} (ref >59)

## 2022-11-17 LAB — MICROALBUMIN / CREATININE URINE RATIO
Creatinine, Urine: 145.1 mg/dL
Microalb/Creat Ratio: 2 mg/g creat (ref 0–29)
Microalbumin, Urine: 3.2 ug/mL

## 2022-11-17 LAB — HEMOGLOBIN A1C

## 2022-11-22 ENCOUNTER — Other Ambulatory Visit: Payer: Self-pay

## 2022-11-22 DIAGNOSIS — E119 Type 2 diabetes mellitus without complications: Secondary | ICD-10-CM

## 2022-11-29 LAB — HEMOGLOBIN A1C
Est. average glucose Bld gHb Est-mCnc: 143 mg/dL
Hgb A1c MFr Bld: 6.6 % — ABNORMAL HIGH (ref 4.8–5.6)

## 2022-12-01 ENCOUNTER — Telehealth: Payer: Self-pay

## 2022-12-01 NOTE — Telephone Encounter (Signed)
Pt notified of lab results

## 2022-12-01 NOTE — Telephone Encounter (Signed)
-----   Message from Crist Fat, MD sent at 11/30/2022 10:02 PM EDT ----- Her hgba1c is controlled.  Continue her current meds.

## 2022-12-14 DIAGNOSIS — R87610 Atypical squamous cells of undetermined significance on cytologic smear of cervix (ASC-US): Secondary | ICD-10-CM | POA: Insufficient documentation

## 2022-12-27 ENCOUNTER — Other Ambulatory Visit: Payer: Self-pay

## 2022-12-27 MED ORDER — HYDROCHLOROTHIAZIDE 25 MG PO TABS: 25.0000 mg | ORAL_TABLET | Freq: Every day | ORAL | 1 refills | Status: DC

## 2022-12-27 MED ORDER — MOUNJARO 2.5 MG/0.5ML ~~LOC~~ SOAJ: 2.5000 mg | SUBCUTANEOUS | 0 refills | Status: DC

## 2023-02-15 ENCOUNTER — Ambulatory Visit: Payer: BC Managed Care – PPO | Admitting: Internal Medicine

## 2023-02-15 ENCOUNTER — Encounter: Payer: Self-pay | Admitting: Internal Medicine

## 2023-02-15 VITALS — BP 108/82 | HR 94 | Temp 98.3°F | Resp 16 | Ht 65.0 in | Wt 252.0 lb

## 2023-02-15 DIAGNOSIS — E119 Type 2 diabetes mellitus without complications: Secondary | ICD-10-CM

## 2023-02-15 DIAGNOSIS — L918 Other hypertrophic disorders of the skin: Secondary | ICD-10-CM | POA: Diagnosis not present

## 2023-02-15 DIAGNOSIS — Z6841 Body Mass Index (BMI) 40.0 and over, adult: Secondary | ICD-10-CM | POA: Diagnosis not present

## 2023-02-15 MED ORDER — MOUNJARO 12.5 MG/0.5ML ~~LOC~~ SOAJ: 12.5000 mg | SUBCUTANEOUS | 3 refills | Status: DC

## 2023-02-15 MED ORDER — MOUNJARO 10 MG/0.5ML ~~LOC~~ SOAJ: 10.0000 mg | SUBCUTANEOUS | 0 refills | Status: DC

## 2023-02-15 NOTE — Progress Notes (Signed)
Office Visit  Subjective   Patient ID: Kendra Ruiz   DOB: [birth year]   Age: 42 y.o.   MRN: [id number]   Chief Complaint Chief Complaint  Patient presents with   Follow-up     History of Present Illness The patient is a 42 year old female who returns today for followup of her T2 diabetes. She was diagnosed with T2 diabetes in 2017.  On her last visit, we did stop her off the metformin and placed on mounjaro.  She is currently on:  Mounjaro 5mg  subcut weekly.   She has lost 5 lbs since her last visit. She is not exercising as much as they would like. She specifically denies unexplained nausea or vomiting or hypoglycemia. She does not routinely check blood sugars. She has not had a HgBA1c was done 3 months ago and was 6.6%.  She came in fasting today in anticipation of lab work. She has no long term complications of diabetic retinopathy, nephropathy, neuropathy or cardiovascular disease.  Her last eye exam was a few months ago and she was told she does not have any diabetic changes.   She also states she noted a skin tag years ago that is getting better.  This has become irriated but no bleeding.     Past Medical History Past Medical History:  Diagnosis Date   Allergic rhinitis    Anxiety    GERD (gastroesophageal reflux disease)    Headache    Hypertension    T2DM (type 2 diabetes mellitus) ([facility])      Allergies Allergies  Allergen Reactions   Bactrim [Sulfamethoxazole-Trimethoprim] Diarrhea   Keflex [Cephalexin] Rash     Medications  Current Outpatient Medications:    clonazePAM (KLONOPIN) 0.5 MG tablet, Take 1 tablet (0.5 mg total) by mouth 3 (three) times daily as needed for anxiety., Disp: 90 tablet, Rfl: 0   hydrochlorothiazide (HYDRODIURIL) 25 MG tablet, Take 1 tablet (25 mg total) by mouth daily., Disp: 90 tablet, Rfl: 1   Multiple Vitamins-Minerals (MULTIVITAMINS THER. W/MINERALS) TABS, Take 1 tablet by mouth daily., Disp: , Rfl:    omeprazole (PRILOSEC) 20 MG  capsule, Take one tablet twice a day for 1 week and then once a day with meals for heartburn, Disp: 30 capsule, Rfl: 3   tirzepatide (MOUNJARO) 2.5 MG/0.5ML Pen, Inject 2.5 mg into the skin once a week., Disp: 2 mL, Rfl: 0   tirzepatide (MOUNJARO) 5 MG/0.5ML Pen, Inject 5 mg into the skin once a week., Disp: 2 mL, Rfl: 0   tirzepatide (MOUNJARO) 7.5 MG/0.5ML Pen, Inject 7.5 mg into the skin once a week., Disp: 2 mL, Rfl: 0   valsartan (DIOVAN) 80 MG tablet, Take 1 tablet (80 mg total) by mouth daily., Disp: 90 tablet, Rfl: 3   Review of Systems Review of Systems  Constitutional:  Negative for chills and fever.  Eyes:  Negative for blurred vision and double vision.  Respiratory:  Negative for cough and shortness of breath.   Cardiovascular:  Negative for chest pain, palpitations and leg swelling.  Gastrointestinal:  Negative for abdominal pain, constipation, diarrhea, nausea and vomiting.  Genitourinary:  Negative for frequency.  Musculoskeletal:  Negative for myalgias.  Neurological:  Negative for dizziness, weakness and headaches.  Endo/Heme/Allergies:  Negative for polydipsia.       Objective:    Vitals BP 108/82   Pulse 94   Temp 98.3 F (36.8 C)   Resp 16   Ht 5\' 5"  (1.651 m)   Wt 252  lb (114.3 kg)   SpO2 98%   BMI 41.93 kg/m    Physical Examination Physical Exam Constitutional:      Appearance: Normal appearance. She is not ill-appearing.  Cardiovascular:     Rate and Rhythm: Normal rate and regular rhythm.     Pulses: Normal pulses.     Heart sounds: No murmur heard.    No friction rub. No gallop.  Pulmonary:     Effort: Pulmonary effort is normal. No respiratory distress.     Breath sounds: No wheezing, rhonchi or rales.  Abdominal:     General: Bowel sounds are normal. There is no distension.     Palpations: Abdomen is soft.     Tenderness: There is no abdominal tenderness.  Musculoskeletal:     Right lower leg: No edema.     Left lower leg: No edema.   Skin:    General: Skin is warm and dry.     Findings: No rash.     Comments: She has a pedunculated skin tag about 0.5cm x 0.5cm on her left flank.    Neurological:     Mental Status: She is alert.        Assessment & Plan:   Skin tag She wants a referral to dermatology to have this skin tag removed as it is irritating.  T2 Diabetes mellitus without complication ([facility]) I want her to increase her mounjaro to 7.5mg  sub weekly after 1 month of completing her 5mg  dose.  Then we will go to 10mg  weekly, then 12.5mg  weekly.  BMI 40.0-44.9, adult [facility]) She is on mounjaro and she is losing weight.  She has morbid obesity assoicated with DM and HTN.  I want her to eat healthy, try to exercise more and continue to lose weight.  Morbid obesity ([facility]) As above.    Return in about 3 months (around 05/18/2023).   Crist Fat, MD

## 2023-02-15 NOTE — Assessment & Plan Note (Signed)
I want her to increase her mounjaro to 7.5mg  sub weekly after 1 month of completing her 5mg  dose.  Then we will go to 10mg  weekly, then 12.5mg  weekly.

## 2023-02-15 NOTE — Assessment & Plan Note (Signed)
She wants a referral to dermatology to have this skin tag removed as it is irritating.

## 2023-02-15 NOTE — Assessment & Plan Note (Signed)
She is on mounjaro and she is losing weight.  She has morbid obesity assoicated with DM and HTN.  I want her to eat healthy, try to exercise more and continue to lose weight.

## 2023-02-15 NOTE — Assessment & Plan Note (Signed)
As above.

## 2023-02-16 LAB — HEMOGLOBIN A1C
Est. average glucose Bld gHb Est-mCnc: 128 mg/dL
Hgb A1c MFr Bld: 6.1 % — ABNORMAL HIGH (ref 4.8–5.6)

## 2023-04-04 ENCOUNTER — Other Ambulatory Visit: Payer: Self-pay

## 2023-04-04 ENCOUNTER — Other Ambulatory Visit (HOSPITAL_COMMUNITY): Payer: Self-pay

## 2023-04-04 MED ORDER — VALSARTAN 80 MG PO TABS
80.0000 mg | ORAL_TABLET | Freq: Every day | ORAL | 1 refills | Status: DC
  Filled 2023-04-04: qty 30, 30d supply, fill #0

## 2023-04-17 ENCOUNTER — Other Ambulatory Visit (HOSPITAL_COMMUNITY): Payer: Self-pay

## 2023-05-17 ENCOUNTER — Encounter: Payer: Self-pay | Admitting: Internal Medicine

## 2023-05-17 ENCOUNTER — Ambulatory Visit: Payer: BC Managed Care – PPO | Admitting: Internal Medicine

## 2023-05-17 VITALS — BP 120/76 | HR 99 | Temp 98.3°F | Resp 18 | Ht 65.0 in | Wt 245.6 lb

## 2023-05-17 DIAGNOSIS — F39 Unspecified mood [affective] disorder: Secondary | ICD-10-CM

## 2023-05-17 DIAGNOSIS — Z6841 Body Mass Index (BMI) 40.0 and over, adult: Secondary | ICD-10-CM

## 2023-05-17 DIAGNOSIS — I10 Essential (primary) hypertension: Secondary | ICD-10-CM | POA: Diagnosis not present

## 2023-05-17 DIAGNOSIS — E119 Type 2 diabetes mellitus without complications: Secondary | ICD-10-CM | POA: Diagnosis not present

## 2023-05-17 MED ORDER — VILAZODONE HCL 20 MG PO TABS: 20.0000 mg | ORAL_TABLET | Freq: Every day | ORAL | 2 refills | Status: DC

## 2023-05-17 MED ORDER — MOUNJARO 15 MG/0.5ML ~~LOC~~ SOAJ: 15.0000 mg | SUBCUTANEOUS | 5 refills | Status: DC

## 2023-05-17 MED ORDER — VILAZODONE HCL 10 MG PO TABS: 10.0000 mg | ORAL_TABLET | Freq: Every day | ORAL | 0 refills | Status: DC

## 2023-05-17 NOTE — Assessment & Plan Note (Signed)
Plan as above.  

## 2023-05-17 NOTE — Assessment & Plan Note (Signed)
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  We discussed different meds and I want to try her on viibryd 20mg  daily.

## 2023-05-17 NOTE — Assessment & Plan Note (Signed)
I had a discussion with her regarding lifestyle modfication.  I want her to start walking daily for exercise and we discussed her diet.  She needs to eat more healthy and choose more healthy snacks.  Cut down on fast food or choose more healthy options.  I want her to cut her soda back by 1 soda per day.  We will continue on her mounjaro.

## 2023-05-17 NOTE — Assessment & Plan Note (Signed)
Her BP is currently controlled.  We will continue her on her current meds.

## 2023-05-17 NOTE — Progress Notes (Signed)
Office Visit  Subjective   Patient ID: Kendra Ruiz   DOB: 18-Nov-1980   Age: 42 y.o.   MRN: [id number]   Chief Complaint Chief Complaint  Patient presents with   Follow-up     History of Present Illness The patient is a 42 year old female who returns today for followup of her T2 diabetes. She was diagnosed with T2 diabetes in 08/09/2016.  This past year, we did stop her off the metformin and placed on mounjaro.  She is currently on:  Mounjaro 12.5mg  subcut weekly.  She is not exercising as much as they would like. She specifically denies unexplained nausea or vomiting or hypoglycemia. She does not routinely check blood sugars. She has not had a HgBA1c was done 6 months ago and was 6.1%.  She came in fasting today in anticipation of lab work. She has no long term complications of diabetic retinopathy, nephropathy, neuropathy or cardiovascular disease.  Her last eye exam about a year ago.    She is also here for weight loss management.  She was started on mounjaro in 10/2022 and her weight at that time was 257lb.  She currently weighs 245 lbs.  The patient has been on adipex in the past.  She states she is not eating healthy as she is supposed to.  She is drinking sodas twice a day but no sweet teas.  She usually eats junk food for snacks.  The patient is not exercising.  She denies any problems with mounjaro.  The patient is a 42 year old female who presents for a follow-up evaluation of hypertension.  She was diagnosed with HTN in 08-09-2013.  The patient has been checking her blood pressure at home. The patient's blood pressure has ranged systollically 120's. The patient's current medications include: diovan 80mg  daily and HCTZ 25mg  daily. The patient has been tolerating her medications well. The patient denies any headache, visual changes, dizziness, lightheadness, chest pain, shortness of breath, weakness/numbness, and edema.   The patient is a 42 year old female who returns for followup of her  anxiety/stress but also has a history of depression.   She states she has noticed she has become more angry and more mood disturbance.  She was diagnosed with depression by her PCP in 2015-08-10 where they started her on bupropion at that time.  She states her husband died in 08/10/2019 and she has been more anxious/stress at that time.  Today, she denies any depression but states her anxiety is moderate.  She has not had a panic attack in a "long time".  The patient stopped bupropion right after she started it as she was afraid of side effects.  She is currently on clonazepam 0.5mg  po TID prn which she first started in 2015/08/10.  Today, she states she uses it maybe 3-4 times per month.  She does report some insomnia where she has no problems getting to sleep but she does wake up.  She states she does snore.  She denies any difficulty performing routine daily activities, extreme feelings of guilt, feelings of isolation, loss of interest in pleasurable activities, difficulty concentrating, fatigue, feelings of worthlessness, helpless feeling, suicidal ideation, homicidal ideation, weight loss, loss of appetite, social withdrawal, and out of control feelings.  She states she feels overwhelmed at times.  This patient feels that she is able to care for herself. She currently lives with her son. She has never been admitted to a psychiatric facility. She has never seen a psychiatrist or  a therapist in the past.  She has tried lexapro in the past but this caused aorgasmia.       Past Medical History Past Medical History:  Diagnosis Date   Allergic rhinitis    Anxiety    GERD (gastroesophageal reflux disease)    Headache    Hypertension    T2DM (type 2 diabetes mellitus) ([facility])      Allergies Allergies  Allergen Reactions   Bactrim [Sulfamethoxazole-Trimethoprim] Diarrhea   Keflex [Cephalexin] Rash     Medications  Current Outpatient Medications:    clonazePAM (KLONOPIN) 0.5 MG tablet, Take 1 tablet (0.5 mg total)  by mouth 3 (three) times daily as needed for anxiety., Disp: 90 tablet, Rfl: 0   hydrochlorothiazide (HYDRODIURIL) 25 MG tablet, Take 1 tablet (25 mg total) by mouth daily., Disp: 90 tablet, Rfl: 1   Multiple Vitamins-Minerals (MULTIVITAMINS THER. W/MINERALS) TABS, Take 1 tablet by mouth daily., Disp: , Rfl:    omeprazole (PRILOSEC) 20 MG capsule, Take one tablet twice a day for 1 week and then once a day with meals for heartburn, Disp: 30 capsule, Rfl: 3   valsartan (DIOVAN) 80 MG tablet, Take 1 tablet (80 mg total) by mouth daily., Disp: 90 tablet, Rfl: 1   Review of Systems Review of Systems  Constitutional:  Negative for chills, fever and malaise/fatigue.  Eyes:  Negative for blurred vision and double vision.  Respiratory:  Negative for cough and shortness of breath.   Cardiovascular:  Negative for chest pain, palpitations and leg swelling.  Gastrointestinal:  Negative for abdominal pain, constipation, diarrhea, nausea and vomiting.  Genitourinary:  Negative for frequency.  Musculoskeletal:  Negative for myalgias.  Skin:  Negative for itching and rash.  Neurological:  Negative for dizziness, weakness and headaches.  Endo/Heme/Allergies:  Negative for polydipsia.       Objective:    Vitals BP 120/76   Pulse 99   Temp 98.3 F (36.8 C)   Resp 18   Ht 5\' 5"  (1.651 m)   Wt 245 lb 9.6 oz (111.4 kg)   SpO2 98%   BMI 40.87 kg/m    Physical Examination Physical Exam Constitutional:      Appearance: Normal appearance. She is not ill-appearing.  Cardiovascular:     Rate and Rhythm: Normal rate and regular rhythm.     Pulses: Normal pulses.     Heart sounds: No murmur heard.    No friction rub. No gallop.  Pulmonary:     Effort: Pulmonary effort is normal. No respiratory distress.     Breath sounds: No wheezing, rhonchi or rales.  Abdominal:     General: Bowel sounds are normal. There is no distension.     Palpations: Abdomen is soft.     Tenderness: There is no abdominal  tenderness.  Musculoskeletal:     Right lower leg: No edema.     Left lower leg: No edema.  Skin:    General: Skin is warm and dry.     Findings: No rash.  Neurological:     General: No focal deficit present.     Mental Status: She is alert and oriented to person, place, and time.  Psychiatric:        Mood and Affect: Mood normal.        Behavior: Behavior normal.        Assessment & Plan:   Essential hypertension Her BP is currently controlled.  We will continue her on her current meds.  T2 Diabetes mellitus  without complication ([facility]) I want her to increase her mounjaro to 12.5mg  subcut weekly.  We will check her HgBA1c on her next visit.  Morbid obesity ([facility]) I had a discussion with her regarding lifestyle modfication.  I want her to start walking daily for exercise and we discussed her diet.  She needs to eat more healthy and choose more healthy snacks.  Cut down on fast food or choose more healthy options.  I want her to cut her soda back by 1 soda per day.  We will continue on her mounjaro.  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  We discussed different meds and I want to try her on viibryd 20mg  daily.  BMI 40.0-44.9, adult [facility]) Plan as above.    Return in about 3 months (around 08/16/2023).   Crist Fat, MD

## 2023-05-17 NOTE — Assessment & Plan Note (Signed)
I want her to increase her mounjaro to 12.5mg  subcut weekly.  We will check her HgBA1c on her next visit.

## 2023-08-28 ENCOUNTER — Ambulatory Visit: Payer: BC Managed Care – PPO | Admitting: Internal Medicine

## 2023-09-04 ENCOUNTER — Encounter: Payer: Self-pay | Admitting: Internal Medicine

## 2023-09-04 ENCOUNTER — Ambulatory Visit: Payer: BC Managed Care – PPO | Admitting: Internal Medicine

## 2023-09-04 VITALS — BP 120/78 | HR 99 | Temp 98.7°F | Resp 17 | Ht 65.0 in | Wt 249.6 lb

## 2023-09-04 DIAGNOSIS — F411 Generalized anxiety disorder: Secondary | ICD-10-CM

## 2023-09-04 DIAGNOSIS — Z6841 Body Mass Index (BMI) 40.0 and over, adult: Secondary | ICD-10-CM

## 2023-09-04 DIAGNOSIS — F330 Major depressive disorder, recurrent, mild: Secondary | ICD-10-CM | POA: Diagnosis not present

## 2023-09-04 DIAGNOSIS — E559 Vitamin D deficiency, unspecified: Secondary | ICD-10-CM

## 2023-09-04 DIAGNOSIS — E119 Type 2 diabetes mellitus without complications: Secondary | ICD-10-CM | POA: Diagnosis not present

## 2023-09-04 MED ORDER — MOUNJARO 15 MG/0.5ML ~~LOC~~ SOAJ: 15.0000 mg | SUBCUTANEOUS | 5 refills | Status: DC

## 2023-09-04 MED ORDER — BUPROPION HCL ER (XL) 150 MG PO TB24: 150.0000 mg | ORAL_TABLET | Freq: Every day | ORAL | 2 refills | Status: DC

## 2023-09-04 NOTE — Assessment & Plan Note (Signed)
 Plan as above.

## 2023-09-04 NOTE — Assessment & Plan Note (Signed)
 I want her to exercise more and continue to modify her diet and have healthy choices.  I want her to reduce her calories and continue to lose weight.

## 2023-09-04 NOTE — Assessment & Plan Note (Signed)
 We will continue on mounjaro.  We will check a HgBA1c today and adjust as needed.

## 2023-09-04 NOTE — Assessment & Plan Note (Signed)
 As above.

## 2023-09-04 NOTE — Progress Notes (Signed)
 Office Visit  Subjective   Patient ID: Kendra Ruiz   DOB: [birth year]   Age: 43 y.o.   MRN: [id number]   Chief Complaint Chief Complaint  Patient presents with   Follow-up     History of Present Illness The patient is a 43 year old female who returns today for followup of her T2 diabetes. She was diagnosed with T2 diabetes in August 02, 2016.  This past year, we did stop her off the metformin  and placed on mounjaro .  She is currently on:  Mounjaro  15mg  subcut weekly.  She is not exercising as much as they would like. She specifically denies unexplained nausea or vomiting or hypoglycemia. She does not routinely check blood sugars. She has not had a HgBA1c was done 6-7 months ago and was 6.1%.  She came in fasting today in anticipation of lab work. She has no long term complications of diabetic retinopathy, nephropathy, neuropathy or cardiovascular disease.  Her last eye exam a few weeks ago.   She is also here for weight loss management.  She was started on mounjaro  in 10/2022 and her weight at that time was 257lb.  She currently weighs 245 lbs.  The patient has been on adipex in the past.  She is now on mounjaro  15mg  subcut weekly.  She has cut back on her sodas where she will drink maybe one soda per day.  She is exercising by using a squat machine and she is trying exercise.  She cut back on junk food and she has improved with eating fast food. She denies any side effects with mounjaro .  She has gained 4 lbs since her last visit.      The patient is a 43 year old female who returns for followup of her anxiety/stress and depression.  I felt she had worsening mood disorder when I saw and we started her on viibryd  and uptitrated to 20mg  daily.  She states that over the interim, this did help with her anxiety and depression but she still had problems with aorgasmia.   It also helped with her anger and mood disturbance.  She was diagnosed with depression by her PCP in 2015-08-03 where they started her on bupropion   at that time.  She states her husband died in 08/03/2019 and she has been more anxious/stress at that time.  Today, she denies any depression but states her anxiety is moderate.  She has not had a panic attack in a long time.  The patient stopped bupropion  right after she started it as she was afraid of side effects.  She is currently on clonazepam  0.5mg  po TID prn which she first started in August 03, 2015.  Today, she states she uses it maybe 3-4 times per month.  She does report some insomnia where she has no problems getting to sleep but she does wake up.  She states she does snore.  She denies any difficulty performing routine daily activities, extreme feelings of guilt, feelings of isolation, loss of interest in pleasurable activities, difficulty concentrating, fatigue, feelings of worthlessness, helpless feeling, suicidal ideation, homicidal ideation, weight loss, loss of appetite, social withdrawal, and out of control feelings.  She states she feels overwhelmed at times.  This patient feels that she is able to care for herself. She currently lives with her son. She has never been admitted to a psychiatric facility. She has never seen a psychiatrist or a therapist in the past.  She has tried lexapro  in the past but this caused aorgasmia.  Past Medical History Past Medical History:  Diagnosis Date   Allergic rhinitis    Anxiety    GERD (gastroesophageal reflux disease)    Headache    Hypertension    T2DM (type 2 diabetes mellitus) (HCC)      Allergies Allergies  Allergen Reactions   Bactrim [Sulfamethoxazole-Trimethoprim] Diarrhea   Keflex [Cephalexin] Rash     Medications  Current Outpatient Medications:    clonazePAM  (KLONOPIN ) 0.5 MG tablet, Take 1 tablet (0.5 mg total) by mouth 3 (three) times daily as needed for anxiety., Disp: 90 tablet, Rfl: 0   hydrochlorothiazide  (HYDRODIURIL ) 25 MG tablet, Take 1 tablet (25 mg total) by mouth daily., Disp: 90 tablet, Rfl: 1   Multiple  Vitamins-Minerals (MULTIVITAMINS THER. W/MINERALS) TABS, Take 1 tablet by mouth daily., Disp: , Rfl:    omeprazole  (PRILOSEC) 20 MG capsule, Take one tablet twice a day for 1 week and then once a day with meals for heartburn, Disp: 30 capsule, Rfl: 3   tirzepatide  (MOUNJARO ) 15 MG/0.5ML Pen, Inject 15 mg into the skin once a week., Disp: 2 mL, Rfl: 5   valsartan  (DIOVAN ) 80 MG tablet, Take 1 tablet (80 mg total) by mouth daily., Disp: 90 tablet, Rfl: 1   Review of Systems Review of Systems  Constitutional:  Negative for chills and fever.  Eyes:  Negative for blurred vision and double vision.  Respiratory:  Negative for cough and shortness of breath.   Cardiovascular:  Negative for chest pain, palpitations and leg swelling.  Gastrointestinal:  Positive for constipation. Negative for abdominal pain, diarrhea, nausea and vomiting.  Genitourinary:  Negative for frequency.  Musculoskeletal:  Negative for myalgias.  Skin:  Negative for itching and rash.  Neurological:  Negative for dizziness, weakness and headaches.  Endo/Heme/Allergies:  Negative for polydipsia.  Psychiatric/Behavioral:  Negative for substance abuse and suicidal ideas.        Objective:    Vitals BP 120/78   Pulse 99   Temp 98.7 F (37.1 C)   Resp 17   Ht 5' 5 (1.651 m)   Wt 249 lb 9.6 oz (113.2 kg)   SpO2 (!) 18%   BMI 41.54 kg/m    Physical Examination Physical Exam Constitutional:      Appearance: Normal appearance. She is not ill-appearing.  Cardiovascular:     Rate and Rhythm: Normal rate and regular rhythm.     Pulses: Normal pulses.     Heart sounds: No murmur heard.    No friction rub. No gallop.  Pulmonary:     Effort: Pulmonary effort is normal. No respiratory distress.     Breath sounds: No wheezing, rhonchi or rales.  Abdominal:     General: Bowel sounds are normal. There is no distension.     Palpations: Abdomen is soft.     Tenderness: There is no abdominal tenderness.  Musculoskeletal:      Right lower leg: No edema.     Left lower leg: No edema.  Skin:    General: Skin is warm and dry.     Findings: No rash.  Neurological:     General: No focal deficit present.     Mental Status: She is alert and oriented to person, place, and time.  Psychiatric:        Mood and Affect: Mood normal.        Behavior: Behavior normal.        Assessment & Plan:   T2 Diabetes mellitus without complication (HCC) We will continue  on mounjaro .  We will check a HgBA1c today and adjust as needed.  Morbid obesity (HCC) I want her to exercise more and continue to modify her diet and have healthy choices.  I want her to reduce her calories and continue to lose weight.  Mild episode of recurrent major depressive disorder (HCC) She is still having aorgasmia.  I am going to stop the viibryd  and start her on wellbutrin  XL 150mg  daily.  BMI 40.0-44.9, adult [facility]) Plan as above.  GAD (generalized anxiety disorder) As above.    Return in about 3 months (around 12/03/2023).   Selinda Fleeta Finger, MD

## 2023-09-04 NOTE — Assessment & Plan Note (Signed)
 She is still having aorgasmia.  I am going to stop the viibryd and start her on wellbutrin XL 150mg  daily.

## 2023-09-05 LAB — HEMOGLOBIN A1C
Est. average glucose Bld gHb Est-mCnc: 117 mg/dL
Hgb A1c MFr Bld: 5.7 % — ABNORMAL HIGH (ref 4.8–5.6)

## 2023-09-05 LAB — VITAMIN D 25 HYDROXY (VIT D DEFICIENCY, FRACTURES): Vit D, 25-Hydroxy: 21 ng/mL — ABNORMAL LOW (ref 30.0–100.0)

## 2023-09-26 ENCOUNTER — Other Ambulatory Visit: Payer: Self-pay

## 2023-09-26 ENCOUNTER — Other Ambulatory Visit: Payer: Self-pay | Admitting: Internal Medicine

## 2023-09-26 DIAGNOSIS — F411 Generalized anxiety disorder: Secondary | ICD-10-CM

## 2023-09-26 MED ORDER — BUPROPION HCL ER (XL) 150 MG PO TB24: 150.0000 mg | ORAL_TABLET | Freq: Every day | ORAL | 2 refills | Status: DC

## 2023-09-26 NOTE — Progress Notes (Signed)
 Rx refill

## 2023-10-04 ENCOUNTER — Other Ambulatory Visit: Payer: Self-pay

## 2023-10-04 MED ORDER — VALSARTAN 80 MG PO TABS: 80.0000 mg | ORAL_TABLET | Freq: Every day | ORAL | 1 refills | Status: DC

## 2023-10-04 NOTE — Progress Notes (Signed)
Rx refill

## 2023-10-05 NOTE — Progress Notes (Signed)
Her sugars are doing well. She needs to take vitamin d. Just double whaever she is on. If he is not on anything, start vit d 1000 Units daily  Patient aware of lab results

## 2023-10-18 ENCOUNTER — Other Ambulatory Visit: Payer: Self-pay | Admitting: Internal Medicine

## 2023-10-18 MED ORDER — PHENTERMINE HCL 37.5 MG PO TABS
37.5000 mg | ORAL_TABLET | Freq: Every day | ORAL | 0 refills | Status: DC
Start: 2023-10-18 — End: 2023-11-30

## 2023-10-19 ENCOUNTER — Other Ambulatory Visit: Payer: Self-pay | Admitting: Internal Medicine

## 2023-11-30 ENCOUNTER — Ambulatory Visit: Payer: BC Managed Care – PPO | Admitting: Internal Medicine

## 2023-11-30 ENCOUNTER — Encounter: Payer: Self-pay | Admitting: Internal Medicine

## 2023-11-30 VITALS — BP 134/80 | HR 95 | Temp 98.0°F | Resp 18 | Ht 65.0 in | Wt 257.8 lb

## 2023-11-30 DIAGNOSIS — F330 Major depressive disorder, recurrent, mild: Secondary | ICD-10-CM

## 2023-11-30 DIAGNOSIS — F411 Generalized anxiety disorder: Secondary | ICD-10-CM | POA: Diagnosis not present

## 2023-11-30 DIAGNOSIS — E119 Type 2 diabetes mellitus without complications: Secondary | ICD-10-CM

## 2023-11-30 DIAGNOSIS — Z6841 Body Mass Index (BMI) 40.0 and over, adult: Secondary | ICD-10-CM

## 2023-11-30 DIAGNOSIS — I10 Essential (primary) hypertension: Secondary | ICD-10-CM

## 2023-11-30 MED ORDER — VALSARTAN 80 MG PO TABS: 80.0000 mg | ORAL_TABLET | Freq: Every day | ORAL | 1 refills | Status: DC

## 2023-11-30 MED ORDER — METFORMIN HCL ER 500 MG PO TB24: 500.0000 mg | ORAL_TABLET | Freq: Two times a day (BID) | ORAL | 3 refills | Status: DC

## 2023-11-30 MED ORDER — PHENTERMINE HCL 37.5 MG PO TABS: 37.5000 mg | ORAL_TABLET | Freq: Every day | ORAL | 2 refills | Status: DC

## 2023-11-30 NOTE — Assessment & Plan Note (Signed)
 Her BP is controlled.  We will continue on her current medications and continue to monitor.

## 2023-11-30 NOTE — Addendum Note (Signed)
 Addended by: Crist Fat on: 11/30/2023 11:30 AM   Modules accepted: Orders

## 2023-11-30 NOTE — Assessment & Plan Note (Signed)
 Plan as above.

## 2023-11-30 NOTE — Assessment & Plan Note (Signed)
 I want her to continue to eat healthy and to exercise.  We will restart her on adipex at this time.  Our goal will be to lose 2-3 lbs per month while on adipex.

## 2023-11-30 NOTE — Progress Notes (Addendum)
 Office Visit  Subjective   Patient ID: Kendra Ruiz   DOB: 03-Mar-1981   Age: 43 y.o.   MRN: [id number]   Chief Complaint Chief Complaint  Patient presents with   Follow-up    3 Month F/U     History of Present Illness The patient is a 43 year old female who returns today for followup of her T2 diabetes. She was diagnosed with T2 diabetes in 12-26-2015.  This past year, we did stop her off the metformin and placed on mounjaro.   She was on mounjaro 15mg  subcut weekly but she stopped this as the deductible was too high for her insurance and she stopped the mounjaro in 08/2023.  She is currently on:  no medications.  She is exercising by walking.  She specifically denies unexplained nausea or vomiting or hypoglycemia. She does not routinely check blood sugars. She has not had a HgBA1c was done 3 months ago and was 5.7%.  She came in fasting today in anticipation of lab work. She has no long term complications of diabetic retinopathy, nephropathy, neuropathy or cardiovascular disease. Her last dilated eye exam was on 08/08/2024 and this showed no evidence of diabetic retinopathy.   She is also here for weight loss management.  She was started on mounjaro in 10/2022 and her weight at that time was 257lb.  She currently weighs 257 lbs and gained 8 lbs over the interim.  The patient has been on adipex in the past.  She has cut back on her sodas where she will drink maybe one soda per day.  She is exercising by using a squat machine and she is walking regularly.  She cut back on junk food and she has improved with eating fast food.   The patient is a 43 year old female who presents for a follow-up evaluation of hypertension.  She was diagnosed with HTN in 2012/12/25.  The patient has been checking her blood pressure at home. The patient's blood pressure has ranged systollically 130's. The patient's current medications include: diovan 80mg  daily and HCTZ 25mg  daily. The patient has been tolerating her medications  well. The patient denies any headache, visual changes, dizziness, lightheadness, chest pain, shortness of breath, weakness/numbness, and edema.     The patient is a 43 year old female who returns for followup of her anxiety/stress and depression.  On her last visit, she was having problems with aorgasmia with viibrdy so we switched her to Wellbutryin XL 150mg  daily.  Over the interim, this did help with her sexual dysfunction and it seems to be controlling her anxiety and depression.  Again, this past year I felt she had worsening mood disorder and started her on viibryd and uptitrated to 20mg  daily.  It also helped with her anger and mood disturbance.  She was diagnosed with depression by her PCP in 12-26-2014 where they started her on bupropion at that time.  She states her husband died in 12/26/2018 and she has been more anxious/stress at that time.  Today, she denies any depression but states her anxiety is moderate.  She has not had a panic attack in a "long time".  The patient stopped bupropion right after she started it as she was afraid of side effects.  She is currently on clonazepam 0.5mg  po TID prn which she first started in 26-Dec-2014.  Today, she states she uses it maybe 3-4 times per month.  She does report some insomnia where she has no problems getting to sleep  but she does wake up.  She states she does snore.  She denies any difficulty performing routine daily activities, extreme feelings of guilt, feelings of isolation, loss of interest in pleasurable activities, difficulty concentrating, fatigue, feelings of worthlessness, helpless feeling, suicidal ideation, homicidal ideation, weight loss, loss of appetite, social withdrawal, and out of control feelings.  She states she feels overwhelmed at times.  This patient feels that she is able to care for herself. She currently lives with her son. She has never been admitted to a psychiatric facility. She has never seen a psychiatrist or a therapist in the past.  She has  tried lexapro in the past but this caused aorgasmia.      Past Medical History Past Medical History:  Diagnosis Date   Allergic rhinitis    Anxiety    GERD (gastroesophageal reflux disease)    Headache    Hypertension    T2DM (type 2 diabetes mellitus) (HCC)      Allergies Allergies  Allergen Reactions   Bactrim [Sulfamethoxazole-Trimethoprim] Diarrhea   Keflex [Cephalexin] Rash     Medications  Current Outpatient Medications:    metFORMIN (GLUCOPHAGE-XR) 500 MG 24 hr tablet, Take 1 tablet (500 mg total) by mouth 2 (two) times daily with a meal., Disp: 180 tablet, Rfl: 3   buPROPion (WELLBUTRIN XL) 150 MG 24 hr tablet, Take 1 tablet (150 mg total) by mouth daily., Disp: 90 tablet, Rfl: 2   clonazePAM (KLONOPIN) 0.5 MG tablet, Take 1 tablet (0.5 mg total) by mouth 3 (three) times daily as needed for anxiety., Disp: 90 tablet, Rfl: 0   hydrochlorothiazide (HYDRODIURIL) 25 MG tablet, TAKE 1 TABLET (25 MG TOTAL) BY MOUTH DAILY., Disp: 90 tablet, Rfl: 1   Multiple Vitamins-Minerals (MULTIVITAMINS THER. W/MINERALS) TABS, Take 1 tablet by mouth daily., Disp: , Rfl:    omeprazole (PRILOSEC) 20 MG capsule, Take one tablet twice a day for 1 week and then once a day with meals for heartburn, Disp: 30 capsule, Rfl: 3   phentermine (ADIPEX-P) 37.5 MG tablet, Take 1 tablet (37.5 mg total) by mouth daily before breakfast., Disp: 30 tablet, Rfl: 2   valsartan (DIOVAN) 80 MG tablet, Take 1 tablet (80 mg total) by mouth daily., Disp: 90 tablet, Rfl: 1   Review of Systems Review of Systems  Constitutional:  Positive for malaise/fatigue. Negative for chills and fever.  Eyes:  Negative for blurred vision and double vision.  Respiratory:  Negative for cough and shortness of breath.   Cardiovascular:  Negative for chest pain, palpitations and leg swelling.  Gastrointestinal:  Negative for abdominal pain, constipation, diarrhea, nausea and vomiting.  Genitourinary:  Negative for frequency.   Musculoskeletal:  Negative for myalgias.  Skin:  Negative for itching and rash.  Neurological:  Negative for dizziness, weakness and headaches.  Endo/Heme/Allergies:  Negative for polydipsia.       Objective:    Vitals BP 134/80   Pulse 95   Temp 98 F (36.7 C) (Temporal)   Resp 18   Ht 5\' 5"  (1.651 m)   Wt 257 lb 12.8 oz (116.9 kg)   SpO2 98%   BMI 42.90 kg/m    Physical Examination Physical Exam Constitutional:      Appearance: Normal appearance. She is not ill-appearing.  Cardiovascular:     Rate and Rhythm: Normal rate and regular rhythm.     Pulses: Normal pulses.     Heart sounds: No murmur heard.    No friction rub. No gallop.  Pulmonary:  Effort: Pulmonary effort is normal. No respiratory distress.     Breath sounds: No wheezing, rhonchi or rales.  Abdominal:     General: Bowel sounds are normal. There is no distension.     Palpations: Abdomen is soft.     Tenderness: There is no abdominal tenderness.  Musculoskeletal:     Right lower leg: No edema.     Left lower leg: No edema.  Skin:    General: Skin is warm and dry.     Findings: No rash.  Neurological:     Mental Status: She is alert.        Assessment & Plan:   Essential hypertension Her BP is controlled.  We will continue on her current medications and continue to monitor.  T2 Diabetes mellitus without complication (HCC) Unfortunately, her insurance was prohibitive for refill of mounjaro.  I am going to check her A1c today since she has been off this for 3 months.  We will restart her on metformin ER 500mg  BID.  She states when she took both pills at the same time, it made her stomach upset.  Mild episode of recurrent major depressive disorder (HCC) Her wellbutrin XL seems to be controlling her depression and anxiety.  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  She is requesting referral for therapy services.  GAD (generalized anxiety  disorder) Plan as above.  Morbid obesity (HCC) I want her to continue to eat healthy and to exercise.  We will restart her on adipex at this time.  Our goal will be to lose 2-3 lbs per month while on adipex.  BMI 40.0-44.9, adult [facility]) Plan as above.    Return in about 3 months (around 02/29/2024) for annual.   Crist Fat, MD

## 2023-11-30 NOTE — Assessment & Plan Note (Signed)
 Unfortunately, her insurance was prohibitive for refill of mounjaro.  I am going to check her A1c today since she has been off this for 3 months.  We will restart her on metformin ER 500mg  BID.  She states when she took both pills at the same time, it made her stomach upset.

## 2023-11-30 NOTE — Assessment & Plan Note (Addendum)
 Her wellbutrin XL seems to be controlling her depression and anxiety.  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  She is requesting referral for therapy services.

## 2023-12-01 LAB — HEMOGLOBIN A1C
Est. average glucose Bld gHb Est-mCnc: 186 mg/dL
Hgb A1c MFr Bld: 8.1 % — ABNORMAL HIGH (ref 4.8–5.6)

## 2023-12-05 ENCOUNTER — Other Ambulatory Visit: Payer: Self-pay | Admitting: Internal Medicine

## 2023-12-05 MED ORDER — GLIPIZIDE ER 5 MG PO TB24
5.0000 mg | ORAL_TABLET | Freq: Every day | ORAL | 3 refills | Status: AC
Start: 2023-12-05 — End: ?

## 2023-12-05 MED ORDER — METFORMIN HCL ER 500 MG PO TB24: 500.0000 mg | ORAL_TABLET | Freq: Every day | ORAL | 3 refills | Status: DC

## 2023-12-05 NOTE — Progress Notes (Signed)
 We will start her on metformin ER 500mg  daily and glipizide XL 5mg  daily.  Metformin ER 1000mg  made her have stomach discomfort.

## 2023-12-06 ENCOUNTER — Other Ambulatory Visit: Payer: Self-pay

## 2023-12-06 MED ORDER — BLOOD GLUCOSE MONITORING SUPPL DEVI: 1.0000 | Freq: Three times a day (TID) | 0 refills | Status: AC

## 2023-12-06 MED ORDER — LANCET DEVICE MISC: 1.0000 | Freq: Three times a day (TID) | 0 refills | Status: AC

## 2023-12-06 MED ORDER — BLOOD GLUCOSE TEST VI STRP: 1.0000 | ORAL_STRIP | Freq: Three times a day (TID) | 2 refills | Status: AC

## 2023-12-06 MED ORDER — LANCETS MISC. MISC: 1.0000 | Freq: Three times a day (TID) | 0 refills | Status: AC

## 2023-12-06 NOTE — Progress Notes (Signed)
 Rx Refill

## 2023-12-11 ENCOUNTER — Ambulatory Visit
Admission: EM | Admit: 2023-12-11 | Discharge: 2023-12-11 | Disposition: A | Discharge status: Home / Self Care | Attending: Family Medicine | Admitting: Family Medicine

## 2023-12-11 DIAGNOSIS — H11151 Pinguecula, right eye: Secondary | ICD-10-CM

## 2023-12-11 DIAGNOSIS — E119 Type 2 diabetes mellitus without complications: Secondary | ICD-10-CM | POA: Insufficient documentation

## 2023-12-11 DIAGNOSIS — K219 Gastro-esophageal reflux disease without esophagitis: Secondary | ICD-10-CM | POA: Insufficient documentation

## 2023-12-11 DIAGNOSIS — F419 Anxiety disorder, unspecified: Secondary | ICD-10-CM | POA: Insufficient documentation

## 2023-12-11 NOTE — ED Provider Notes (Signed)
 [facility]    CSN: [id number] Arrival date & time: 12/11/23  1217      History   Chief Complaint Chief Complaint  Patient presents with   Eye Problem    HPI Kendra Ruiz is a 43 y.o. female.    Eye Problem  Patient is here for an issue with her right eye.  She noted a "bubble" on the white part of her eye since yesterday am.  This is irritating to her, and feels like there is something in eye.  Feels it when blinking, closing her eyes, etc.  She does have contacts, but has not wore them in a while.        Past Medical History:  Diagnosis Date   Allergic rhinitis    Anxiety    GERD (gastroesophageal reflux disease)    Headache    Hypertension    T2DM (type 2 diabetes mellitus) (HCC)     Patient Active Problem List   Diagnosis Date Noted   Gastroesophageal reflux disease 12/11/2023   Anxiety 12/11/2023   Diabetes mellitus (HCC) 12/11/2023   Mild episode of recurrent major depressive disorder (HCC) 09/04/2023   Mood disorder (HCC) 05/17/2023   Atypical squamous cells of undetermined significance (ASCUS) on Papanicolaou smear of cervix 12/14/2022   Snoring 11/16/2022   Type 2 diabetes mellitus (HCC) 11/16/2022   Hypertensive disorder 11/16/2022   GAD (generalized anxiety disorder) 11/16/2022   BMI 40.0-44.9, adult (HCC) 11/16/2022   Acute sinusitis 01/22/2014   Encounter for insertion of intrauterine contraceptive device 05/14/2013   Back pain 05/14/2013   Insomnia 04/03/2013   Migraine 04/03/2013   Allergic rhinitis 04/03/2013   Depression 10/11/2011   Fibroid uterus 09/12/2011   Left ovarian cyst 09/12/2011   RUQ discomfort 09/09/2011   Vitamin D  deficiency 09/09/2011   Gallstones 09/08/2011   Muscle cramps at night 09/08/2011   Mood disorder in conditions classified elsewhere 05/14/2009   Obesity, unspecified 03/20/2009   Malaise and fatigue 03/20/2009    Past Surgical History:  Procedure Laterality Date   CHOLECYSTECTOMY  2013     OB History   No obstetric history on file.      Home Medications    Prior to Admission medications   Medication Sig Start Date End Date Taking? Authorizing Provider  Accu-Chek Softclix Lancets lancets 1 each 3 (three) times daily. 12/06/23  Yes [provider]  bisacodyl (DULCOLAX) 5 MG EC tablet Take 5 mg by mouth daily as needed for moderate constipation.   Yes [provider]  Iodoform POWD Apply 1 each topically. 08/13/21  Yes [provider]  levonorgestrel (MIRENA, 52 MG,) 20 MCG/DAY IUD 1 each by Intrauterine route once. 03/07/23  Yes [provider]  naproxen  sodium (ANAPROX ) 550 MG tablet Take 550 mg by mouth 2 (two) times daily with a meal. 05/04/21  Yes [provider]  Semaglutide,0.25 or 0.5MG /DOS, (OZEMPIC, 0.25 OR 0.5 MG/DOSE,) 2 MG/1.5ML SOPN 0.5MG  INJECTION ONCE WEEKLY 08/04/21  Yes [provider]  acetaminophen -codeine (TYLENOL  #3) 300-30 MG tablet Take 1 tablet by mouth every 4 (four) hours as needed.    [provider]  Blood Glucose Monitoring Suppl DEVI 1 each by Does not apply route in the morning, at noon, and at bedtime. May substitute to any manufacturer covered by patient's insurance. 12/06/23   Wayne Haines, MD  buPROPion  (WELLBUTRIN  XL) 150 MG 24 hr tablet Take 1 tablet (150 mg total) by mouth daily. 09/26/23  Yes Wayne Haines, MD  Cholecalciferol (VITAMIN D -1000 MAX ST) 25 MCG (1000 UT) tablet Take 2,000 Units by mouth daily.   Yes [provider]  clindamycin (CLEOCIN T) 1 % SWAB Apply 1 Application topically 2 (two) times daily.    [provider]  clonazePAM  (KLONOPIN ) 0.5 MG tablet Take 1 tablet (0.5 mg total) by mouth 3 (three) times daily as needed for anxiety. 11/16/22   Wayne Haines, MD  Drospirenone (SLYND) 4 MG TABS Take 4 mg by mouth as directed.    [provider]  glipiZIDE  (GLUCOTROL  XL) 5 MG 24 hr tablet Take 1 tablet (5 mg total) by mouth daily with  breakfast. 12/05/23  Yes Mitcheal Amy, Reymundo Caulk, MD  Glucose Blood (BLOOD GLUCOSE TEST STRIPS) STRP 1 each by In Vitro route in the morning, at noon, and at bedtime. May substitute to any manufacturer covered by patient's insurance. 12/06/23 03/15/24  Wayne Haines, MD  hydrochlorothiazide  (HYDRODIURIL ) 25 MG tablet TAKE 1 TABLET (25 MG TOTAL) BY MOUTH DAILY. 10/20/23  Yes Wayne Haines, MD  Lancet Device MISC 1 each by Does not apply route in the morning, at noon, and at bedtime. May substitute to any manufacturer covered by patient's insurance. 12/06/23 01/05/24  Wayne Haines, MD  Lancets Misc. MISC 1 each by Does not apply route in the morning, at noon, and at bedtime. May substitute to any manufacturer covered by patient's insurance. 12/06/23 01/05/24  Wayne Haines, MD  loratadine (CLARITIN) 10 MG tablet Take 10 mg by mouth daily.    [provider]  medroxyPROGESTERone (DEPO-PROVERA) 150 MG/ML injection Inject 150 mg into the muscle every 3 (three) months.    [provider]  metFORMIN  (GLUCOPHAGE -XR) 500 MG 24 hr tablet Take 1 tablet (500 mg total) by mouth daily with breakfast. 12/05/23  Yes Mitcheal Amy, Reymundo Caulk, MD  metroNIDAZOLE (FLAGYL) 500 MG tablet Take 1 tablet by mouth 2 (two) times daily.    [provider]  Multiple Vitamins-Minerals (MULTIVITAMINS THER. W/MINERALS) TABS Take 1 tablet by mouth daily.    [provider]  omeprazole  (PRILOSEC) 20 MG capsule Take one tablet twice a day for 1 week and then once a day with meals for heartburn 08/26/14   Burnette Carte, MD  phentermine  (ADIPEX-P ) 37.5 MG tablet Take 1 tablet (37.5 mg total) by mouth daily before breakfast. 11/30/23  Yes Mitcheal Amy, Reymundo Caulk, MD  tirzepatide  (MOUNJARO ) 10 MG/0.5ML Pen INJECT 10 MG INTO THE SKIN ONE TIME PER WEEK    [provider]  tirzepatide  (MOUNJARO ) 12.5 MG/0.5ML Pen Inject 12.5 mg into the skin once a week.    [provider]  traZODone (DESYREL) 50 MG tablet Take 1 tablet by  mouth daily.    [provider]  tretinoin (RETIN-A) 0.025 % cream Apply 1 Application topically at bedtime.    [provider]  valsartan  (DIOVAN ) 80 MG tablet Take 1 tablet (80 mg total) by mouth daily. 11/30/23  Yes Wayne Haines, MD    Family History Family History  Problem Relation Age of Onset   Diabetes Sister    Hypertension Sister    Bipolar disorder Sister    Bipolar disorder Sister    Obesity Sister     Social History Social History   Tobacco Use   Smoking status: Former    Current packs/day: 0.50    Average packs/day: 0.5 packs/day for 10.0 years (5.0 ttl pk-yrs)    Types: E-cigarettes, Cigarettes   Smokeless tobacco: Former  Advertising account planner  Vaping status: Former  Substance Use Topics   Alcohol use: Yes    Comment: Weekly   Drug use: No     Allergies   Neem, Bactrim [sulfamethoxazole-trimethoprim], and Keflex [cephalexin]   Review of Systems Review of Systems  Constitutional: Negative.   HENT: Negative.    Eyes:  Positive for pain.  Respiratory: Negative.    Cardiovascular: Negative.   Gastrointestinal: Negative.   Musculoskeletal: Negative.   Psychiatric/Behavioral: Negative.       Physical Exam Triage Vital Signs ED Triage Vitals  Encounter Vitals Group     BP 12/11/23 1240 115/70     Systolic BP Percentile --      Diastolic BP Percentile --      Pulse Rate 12/11/23 1240 (!) 101     Resp 12/11/23 1240 18     Temp 12/11/23 1240 98.7 F (37.1 C)     Temp Source 12/11/23 1240 Oral     SpO2 12/11/23 1240 97 %     Weight 12/11/23 1235 257 lb (116.6 kg)     Height 12/11/23 1235 5\' 5"  (1.651 m)     Head Circumference --      Peak Flow --      Pain Score 12/11/23 1231 0     Pain Loc --      Pain Education --      Exclude from Growth Chart --    No data found.  Updated Vital Signs BP 115/70 (BP Location: Left Arm)   Pulse (!) 101   Temp 98.7 F (37.1 C) (Oral)   Resp 18   Ht 5\' 5"  (1.651 m)   Wt 116.6 kg   LMP  (LMP  Unknown)   SpO2 97%   BMI 42.77 kg/m   Visual Acuity Right Eye Distance: 20/25 (Corrected) Left Eye Distance: 20/20 (Corrected.) Bilateral Distance: 20/20 (Corrected)  Right Eye Near:   Left Eye Near:    Bilateral Near:     Physical Exam Constitutional:      Appearance: Normal appearance. She is normal weight.  Eyes:     General: Lids are normal.     Extraocular Movements: Extraocular movements intact.     Comments: At the upper lateral aspect of the right sclera is a slightly raised, yellow/whitish bump;  no redness or irritation noted otherwise.  Flourescein stain is negative for any FB or abrasion  Neurological:     General: No focal deficit present.     Mental Status: She is alert.  Psychiatric:        Mood and Affect: Mood normal.      UC Treatments / Results  Labs (all labs ordered are listed, but only abnormal results are displayed) Labs Reviewed - No data to display  EKG   Radiology No results found.  Procedures Procedures (including critical care time)  Medications Ordered in UC Medications - No data to display  Initial Impression / Assessment and Plan / UC Course  I have reviewed the triage vital signs and the nursing notes.  Pertinent labs & imaging results that were available during my care of the patient were reviewed by me and considered in my medical decision making (see chart for details).    Final Clinical Impressions(s) / UC Diagnoses   Final diagnoses:  Pinguecula of right eye     Discharge Instructions      You were seen today for a small cyst on the sclera of the eye.  This is a benign condition, usually  due to irritation of the eye.  I recommend you use over the counter artifical tears to help with the irritation.  I also recommend you call you eye specialist to see if there is anything else you can do to have this resolve sooner.     ED Prescriptions   None    PDMP not reviewed this encounter.   Lesle Ras,  MD 12/11/23 1302

## 2023-12-11 NOTE — ED Triage Notes (Signed)
"  I have something going on with my right eye, my vision in the eye is fuzzy, I feel like something is in my eye and can see something in it". No injury known. "I wear glasses and contacts, glasses mostly and have them with me".

## 2023-12-11 NOTE — Discharge Instructions (Signed)
 You were seen today for a small cyst on the sclera of the eye.  This is a benign condition, usually due to irritation of the eye.  I recommend you use over the counter artifical tears to help with the irritation.  I also recommend you call you eye specialist to see if there is anything else you can do to have this resolve sooner.

## 2024-02-06 ENCOUNTER — Ambulatory Visit: Admitting: Psychology

## 2024-02-14 ENCOUNTER — Other Ambulatory Visit: Payer: Self-pay | Admitting: Obstetrics and Gynecology

## 2024-02-14 DIAGNOSIS — R928 Other abnormal and inconclusive findings on diagnostic imaging of breast: Secondary | ICD-10-CM

## 2024-02-20 ENCOUNTER — Ambulatory Visit

## 2024-02-20 ENCOUNTER — Ambulatory Visit
Admission: RE | Admit: 2024-02-20 | Discharge: 2024-02-20 | Disposition: A | Discharge status: Home / Self Care | Source: Ambulatory Visit | Attending: Obstetrics and Gynecology | Admitting: Obstetrics and Gynecology

## 2024-02-20 DIAGNOSIS — R928 Other abnormal and inconclusive findings on diagnostic imaging of breast: Secondary | ICD-10-CM

## 2024-02-29 ENCOUNTER — Encounter: Admitting: Internal Medicine

## 2024-03-20 ENCOUNTER — Other Ambulatory Visit: Payer: Self-pay | Admitting: Internal Medicine

## 2024-03-20 MED ORDER — MOUNJARO 2.5 MG/0.5ML ~~LOC~~ SOAJ: 2.5000 mg | SUBCUTANEOUS | 0 refills | Status: DC

## 2024-03-20 MED ORDER — MOUNJARO 7.5 MG/0.5ML ~~LOC~~ SOAJ: 7.5000 mg | SUBCUTANEOUS | 0 refills | Status: DC

## 2024-03-20 MED ORDER — MOUNJARO 5 MG/0.5ML ~~LOC~~ SOAJ: 5.0000 mg | SUBCUTANEOUS | 0 refills | Status: DC

## 2024-04-08 ENCOUNTER — Encounter: Admitting: Internal Medicine

## 2024-04-12 ENCOUNTER — Encounter: Payer: Self-pay | Admitting: Internal Medicine

## 2024-04-12 ENCOUNTER — Ambulatory Visit: Admitting: Internal Medicine

## 2024-04-12 VITALS — BP 130/90 | HR 104 | Temp 98.3°F | Resp 18 | Ht 65.0 in | Wt 259.8 lb

## 2024-04-12 DIAGNOSIS — F411 Generalized anxiety disorder: Secondary | ICD-10-CM

## 2024-04-12 DIAGNOSIS — E559 Vitamin D deficiency, unspecified: Secondary | ICD-10-CM

## 2024-04-12 DIAGNOSIS — I10 Essential (primary) hypertension: Secondary | ICD-10-CM | POA: Diagnosis not present

## 2024-04-12 DIAGNOSIS — K219 Gastro-esophageal reflux disease without esophagitis: Secondary | ICD-10-CM

## 2024-04-12 DIAGNOSIS — Z0000 Encounter for general adult medical examination without abnormal findings: Secondary | ICD-10-CM

## 2024-04-12 DIAGNOSIS — E119 Type 2 diabetes mellitus without complications: Secondary | ICD-10-CM | POA: Diagnosis not present

## 2024-04-12 DIAGNOSIS — Z6841 Body Mass Index (BMI) 40.0 and over, adult: Secondary | ICD-10-CM | POA: Diagnosis not present

## 2024-04-12 DIAGNOSIS — F330 Major depressive disorder, recurrent, mild: Secondary | ICD-10-CM

## 2024-04-12 MED ORDER — PHENTERMINE HCL 37.5 MG PO TABS: 37.5000 mg | ORAL_TABLET | Freq: Every day | ORAL | 2 refills | Status: DC

## 2024-04-12 NOTE — Assessment & Plan Note (Signed)
We will check her Vit D level today.

## 2024-04-12 NOTE — Assessment & Plan Note (Signed)
 She should be better controlled with her diabetes now that she is on mounjaro .  She is uptitrating on her dose.  Her diabetic foot exam was normal.  We will do some urine studies and repeat her HgBa1c.

## 2024-04-12 NOTE — Assessment & Plan Note (Signed)
 She has mild recurrent major depression with mood disorder.  I am going to increase her wellbutrin  XL from 150mg  to 300mg  daily.  Continue clonazpeam as needed.

## 2024-04-12 NOTE — Assessment & Plan Note (Signed)
Her GERD is controlled on omeprazole.

## 2024-04-12 NOTE — Assessment & Plan Note (Signed)
 Plan as above.

## 2024-04-12 NOTE — Assessment & Plan Note (Signed)
 Her BP is doing well.  We will continue on her valsartan  and hydrochlorothiazide .

## 2024-04-12 NOTE — Assessment & Plan Note (Signed)
 Health maintenance discussed.  We will obtain some yearly labs.  She needs a colonoscopy when she is 44.

## 2024-04-12 NOTE — Progress Notes (Signed)
 Office Visit  Subjective   Patient ID: Kendra Ruiz   DOB: [birth year]   Age: 43 y.o.   MRN: [id number]   Chief Complaint Chief Complaint  Patient presents with   Annual Exam    Pt report she goes to a OBGYN office in [place] and thinks she has had her Pap Smear either last year or this year.      History of Present Illness The patient is a 43 yo female who returns today for her annual physical exam.  Her last dilated eye exam was on 08/08/2024 and this showed no evidence of diabetic retinopathy.  There is no family history of colorectal, breast, ovarian or uterine cancer.  She has never had endoscopy performed.  Her last digital screening mammogram was done in 01/2024 and this showed some assymetry.  She had a diagnostic mammogram done on 02/20/2024 and this was negative for malignancy.  Her last PAP smear was in 2024 and was normal.   The patient does not exercise regularly.  She does not smoke.  She does get yearly flu vaccines.  The patient has had 4 COVID-19 vaccines including 2 boosters.  Her father had a heart attack in his 78's and her maternal grandmother had a heart attack in her 80's.  The patient is on an ASA 81mg  daily.  The patient is a 43 year old female who returns today for followup of her T2 diabetes. She was diagnosed with T2 diabetes in 2017.   On her last visit, we could not get her mounjaro  filled and I started her on Glucotrol  XL 5mg  daily and metformin  ER 500mg  daily.  She had to stop the metformin  as it gave her abdominal pain.  She did restart the mounjaro  about 2 weeks ago.  She is currently on:  mounjaro  2.5mg  weekly and glucotrol  XL 5mg  daily.  She is not exercising regularly.  She specifically denies unexplained nausea or vomiting or hypoglycemia. She does not routinely check blood sugars. She has not had a HgBA1c was done 3 months ago and was 8.1%.  She came in fasting today in anticipation of lab work. She has no long term complications of diabetic retinopathy,  nephropathy, neuropathy or cardiovascular disease. Her last dilated eye exam was on 08/08/2024 and this showed no evidence of diabetic retinopathy.   She is also here for weight loss management.  She was started on mounjaro  in 10/2022 and her weight at that time was 257lb.  She restarted mounjaro  about 2 weeks ago.  The patient has been on adipex in the past.  She has cut back on her sodas where she will drink maybe one soda per day.  She is not exercising regularly.  She cut back on junk food and she has improved with eating fast food.  She was started on adipex 37.5mg  daily about 3 months ago.    The patient is a 43 year old female who presents for a follow-up evaluation of hypertension.  She was diagnosed with HTN in 2014.  The patient has been checking her blood pressure at home. The patient's blood pressure has ranged systollically 130's. The patient's current medications include: diovan  80mg  daily and HCTZ 25mg  daily. The patient has been tolerating her medications well. The patient denies any headache, visual changes, dizziness, lightheadness, chest pain, shortness of breath, weakness/numbness, and edema.     The patient is a 43 year old female who returns for followup of her anxiety/stress and depression.  This past year, she  was having problems with aorgasmia with viibrdy so we switched her to Wellbutryin XL 150mg  daily.  Over the interim, this did help with her sexual dysfunction.  She states her mood has worsened and her anxiety is moderate in intensity.  Her depression is mild today.  She is not sure if the wellbutrin  was working as well as it was previously.  Again, this past year I felt she had worsening mood disorder and started her on viibryd  and uptitrated to 20mg  daily.  It also helped with her anger and mood disturbance.  She was diagnosed with depression by her PCP in 2015-05-12 where they started her on bupropion  at that time.  She states her husband died in 2019-05-12 and she has been more  anxious/stress at that time.  Today, she denies any depression but states her anxiety is moderate.  She has not had a panic attack in a long time.  The patient stopped bupropion  right after she started it as she was afraid of side effects.  She is currently on clonazepam  0.5mg  po TID prn which she first started in 2015/05/12.  Today, she states she uses it maybe 3-4 times per month.  She does report some insomnia where she has no problems getting to sleep but she does wake up.  She states she does snore.  She has not taken trazodone in a long time.  She denies any difficulty performing routine daily activities, extreme feelings of guilt, feelings of isolation, loss of interest in pleasurable activities, difficulty concentrating, fatigue, feelings of worthlessness, helpless feeling, suicidal ideation, homicidal ideation, weight loss, loss of appetite, social withdrawal, and out of control feelings.  She states she feels overwhelmed at times.  This patient feels that she is able to care for herself. She currently lives with her son. She has never been admitted to a psychiatric facility. She has never seen a psychiatrist or a therapist in the past.  She has tried lexapro  in the past but this caused aorgasmia.     Past Medical History Past Medical History:  Diagnosis Date   Allergic rhinitis    Anxiety    GERD (gastroesophageal reflux disease)    Headache    Hypertension    T2DM (type 2 diabetes mellitus) (HCC)      Allergies Allergies  Allergen Reactions   Neem Palpitations   Bactrim [Sulfamethoxazole-Trimethoprim] Diarrhea   Keflex [Cephalexin] Rash     Medications  Current Outpatient Medications:    Accu-Chek Softclix Lancets lancets, 1 each 3 (three) times daily., Disp: , Rfl:    bisacodyl (DULCOLAX) 5 MG EC tablet, Take 5 mg by mouth daily as needed for moderate constipation., Disp: , Rfl:    Blood Glucose Monitoring Suppl DEVI, 1 each by Does not apply route in the morning, at noon, and at  bedtime. May substitute to any manufacturer covered by patient's insurance., Disp: 1 each, Rfl: 0   buPROPion  (WELLBUTRIN  XL) 150 MG 24 hr tablet, Take 1 tablet (150 mg total) by mouth daily., Disp: 90 tablet, Rfl: 2   Cholecalciferol (VITAMIN D -1000 MAX ST) 25 MCG (1000 UT) tablet, Take 2,000 Units by mouth daily., Disp: , Rfl:    clindamycin (CLEOCIN T) 1 % SWAB, Apply 1 Application topically 2 (two) times daily., Disp: , Rfl:    clonazePAM  (KLONOPIN ) 0.5 MG tablet, Take 1 tablet (0.5 mg total) by mouth 3 (three) times daily as needed for anxiety., Disp: 90 tablet, Rfl: 0   Drospirenone (SLYND) 4 MG TABS, Take 4  mg by mouth as directed., Disp: , Rfl:    glipiZIDE  (GLUCOTROL  XL) 5 MG 24 hr tablet, Take 1 tablet (5 mg total) by mouth daily with breakfast., Disp: 90 tablet, Rfl: 3   hydrochlorothiazide  (HYDRODIURIL ) 25 MG tablet, TAKE 1 TABLET (25 MG TOTAL) BY MOUTH DAILY., Disp: 90 tablet, Rfl: 1   levonorgestrel (MIRENA, 52 MG,) 20 MCG/DAY IUD, 1 each by Intrauterine route once., Disp: , Rfl:    loratadine (CLARITIN) 10 MG tablet, Take 10 mg by mouth daily., Disp: , Rfl:    Multiple Vitamins-Minerals (MULTIVITAMINS THER. W/MINERALS) TABS, Take 1 tablet by mouth daily., Disp: , Rfl:    omeprazole  (PRILOSEC) 20 MG capsule, Take one tablet twice a day for 1 week and then once a day with meals for heartburn, Disp: 30 capsule, Rfl: 3   phentermine  (ADIPEX-P ) 37.5 MG tablet, Take 1 tablet (37.5 mg total) by mouth daily before breakfast., Disp: 30 tablet, Rfl: 2   tirzepatide  (MOUNJARO ) 10 MG/0.5ML Pen, INJECT 10 MG INTO THE SKIN ONE TIME PER WEEK, Disp: , Rfl:    tirzepatide  (MOUNJARO ) 12.5 MG/0.5ML Pen, Inject 12.5 mg into the skin once a week., Disp: , Rfl:    [START ON 05/19/2024] tirzepatide  (MOUNJARO ) 2.5 MG/0.5ML Pen, Inject 2.5 mg into the skin once a week., Disp: 2 mL, Rfl: 0   [START ON 04/19/2024] tirzepatide  (MOUNJARO ) 5 MG/0.5ML Pen, Inject 5 mg into the skin once a week., Disp: 2 mL, Rfl: 0    tirzepatide  (MOUNJARO ) 7.5 MG/0.5ML Pen, Inject 7.5 mg into the skin once a week., Disp: 2 mL, Rfl: 0   traZODone (DESYREL) 50 MG tablet, Take 1 tablet by mouth daily., Disp: , Rfl:    tretinoin (RETIN-A) 0.025 % cream, Apply 1 Application topically at bedtime., Disp: , Rfl:    valsartan  (DIOVAN ) 80 MG tablet, Take 1 tablet (80 mg total) by mouth daily., Disp: 90 tablet, Rfl: 1   acetaminophen -codeine (TYLENOL  #3) 300-30 MG tablet, Take 1 tablet by mouth every 4 (four) hours as needed., Disp: , Rfl:    Review of Systems Review of Systems  Constitutional:  Negative for chills, fever, malaise/fatigue and weight loss.  Eyes:  Negative for blurred vision and double vision.  Respiratory:  Negative for cough and shortness of breath.   Cardiovascular:  Negative for chest pain, palpitations and leg swelling.  Gastrointestinal:  Negative for abdominal pain, blood in stool, constipation, diarrhea, heartburn, melena, nausea and vomiting.  Genitourinary:  Negative for frequency and urgency.  Musculoskeletal:  Negative for myalgias.  Skin:  Negative for itching and rash.  Neurological:  Negative for dizziness, weakness and headaches.  Endo/Heme/Allergies:  Negative for polydipsia.       Objective:    Vitals BP (!) 130/90   Pulse (!) 104   Temp 98.3 F (36.8 C) (Temporal)   Resp 18   Ht 5' 5 (1.651 m)   Wt 259 lb 12.8 oz (117.8 kg)   SpO2 96%   BMI 43.23 kg/m    Physical Examination Physical Exam Constitutional:      Appearance: Normal appearance. She is not ill-appearing.  HENT:     Head: Normocephalic and atraumatic.     Right Ear: Tympanic membrane, ear canal and external ear normal.     Left Ear: Tympanic membrane, ear canal and external ear normal.     Nose: Nose normal. No congestion or rhinorrhea.     Mouth/Throat:     Mouth: Mucous membranes are moist.  Pharynx: Oropharynx is clear. No oropharyngeal exudate or posterior oropharyngeal erythema.  Eyes:     General: No  scleral icterus.    Conjunctiva/sclera: Conjunctivae normal.     Pupils: Pupils are equal, round, and reactive to light.  Neck:     Vascular: No carotid bruit.  Cardiovascular:     Rate and Rhythm: Normal rate and regular rhythm.     Pulses: Normal pulses.     Heart sounds: No murmur heard.    No friction rub. No gallop.  Pulmonary:     Effort: Pulmonary effort is normal. No respiratory distress.     Breath sounds: No wheezing, rhonchi or rales.  Abdominal:     General: Bowel sounds are normal. There is no distension.     Palpations: Abdomen is soft.     Tenderness: There is no abdominal tenderness.  Musculoskeletal:     Cervical back: Neck supple. No tenderness.     Right lower leg: No edema.     Left lower leg: No edema.  Lymphadenopathy:     Cervical: No cervical adenopathy.  Skin:    General: Skin is warm and dry.     Findings: No rash.  Neurological:     General: No focal deficit present.     Mental Status: She is alert and oriented to person, place, and time.  Psychiatric:        Mood and Affect: Mood normal.        Behavior: Behavior normal.        Assessment & Plan:   Essential hypertension Her BP is doing well.  We will continue on her valsartan  and hydrochlorothiazide .  Gastroesophageal reflux disease Her GERD is controlled on omeprazole .  Type 2 diabetes mellitus (HCC) She should be better controlled with her diabetes now that she is on mounjaro .  She is uptitrating on her dose.  Her diabetic foot exam was normal.  We will do some urine studies and repeat her HgBa1c.  Diabetes mellitus without complication (HCC) She should be better controlled with her diabetes now that she is on mounjaro .  She is uptitrating on her dose.  Her diabetic foot exam was normal.  We will do some urine studies and repeat her HgBa1c.  Vitamin D  deficiency We will check her Vit D level today.  Morbid obesity (HCC) She has morbid obesity associated with HTN, HLD, and DM.  I  want her to continue on mounjaro  for weight loss.  I want her to exercise more, eat healthy and lose weight.  Mild episode of recurrent major depressive disorder (HCC) She has mild recurrent major depression with mood disorder.  I am going to increase her wellbutrin  XL from 150mg  to 300mg  daily.  Continue clonazpeam as needed.  GAD (generalized anxiety disorder) Plan as above.  BMI 40.0-44.9, adult [facility]) Plan as above.  Annual physical exam Health maintenance discussed.  We will obtain some yearly labs.  She needs a colonoscopy when she is 44.    Return in about 3 months (around 07/13/2024).   Selinda Fleeta Finger, MD

## 2024-04-12 NOTE — Assessment & Plan Note (Signed)
 She has morbid obesity associated with HTN, HLD, and DM.  I want her to continue on mounjaro  for weight loss.  I want her to exercise more, eat healthy and lose weight.

## 2024-04-13 LAB — CBC WITH DIFFERENTIAL/PLATELET
Basophils Absolute: 0.1 x10E3/uL (ref 0.0–0.2)
Basos: 1 %
EOS (ABSOLUTE): 0.1 x10E3/uL (ref 0.0–0.4)
Eos: 1 %
Hematocrit: 45.8 % (ref 34.0–46.6)
Hemoglobin: 14.9 g/dL (ref 11.1–15.9)
Immature Grans (Abs): 0 x10E3/uL (ref 0.0–0.1)
Immature Granulocytes: 0 %
Lymphocytes Absolute: 2.2 x10E3/uL (ref 0.7–3.1)
Lymphs: 26 %
MCH: 30.7 pg (ref 26.6–33.0)
MCHC: 32.5 g/dL (ref 31.5–35.7)
MCV: 94 fL (ref 79–97)
Monocytes Absolute: 0.5 x10E3/uL (ref 0.1–0.9)
Monocytes: 6 %
Neutrophils Absolute: 5.6 x10E3/uL (ref 1.4–7.0)
Neutrophils: 66 %
Platelets: 386 x10E3/uL (ref 150–450)
RBC: 4.85 x10E6/uL (ref 3.77–5.28)
RDW: 12.8 % (ref 11.7–15.4)
WBC: 8.4 x10E3/uL (ref 3.4–10.8)

## 2024-04-13 LAB — HEMOGLOBIN A1C
Est. average glucose Bld gHb Est-mCnc: 163 mg/dL
Hgb A1c MFr Bld: 7.3 % — ABNORMAL HIGH (ref 4.8–5.6)

## 2024-04-13 LAB — CMP14 + ANION GAP
ALT: 17 IU/L (ref 0–32)
AST: 20 IU/L (ref 0–40)
Albumin: 4.1 g/dL (ref 3.9–4.9)
Alkaline Phosphatase: 84 IU/L (ref 44–121)
Anion Gap: 15 mmol/L (ref 10.0–18.0)
BUN/Creatinine Ratio: 11 (ref 9–23)
BUN: 8 mg/dL (ref 6–24)
Bilirubin Total: 0.6 mg/dL (ref 0.0–1.2)
CO2: 21 mmol/L (ref 20–29)
Calcium: 9.7 mg/dL (ref 8.7–10.2)
Chloride: 100 mmol/L (ref 96–106)
Creatinine, Ser: 0.7 mg/dL (ref 0.57–1.00)
Globulin, Total: 3.2 g/dL (ref 1.5–4.5)
Glucose: 115 mg/dL — ABNORMAL HIGH (ref 70–99)
Potassium: 3.7 mmol/L (ref 3.5–5.2)
Sodium: 136 mmol/L (ref 134–144)
Total Protein: 7.3 g/dL (ref 6.0–8.5)
eGFR: 110 mL/min/1.73 (ref >59)

## 2024-04-13 LAB — VITAMIN D 25 HYDROXY (VIT D DEFICIENCY, FRACTURES): Vit D, 25-Hydroxy: 26.1 ng/mL — ABNORMAL LOW (ref 30.0–100.0)

## 2024-04-13 LAB — LIPID PANEL
Chol/HDL Ratio: 3.3 ratio (ref 0.0–4.4)
Cholesterol, Total: 136 mg/dL (ref 100–199)
HDL: 41 mg/dL (ref >39)
LDL Chol Calc (NIH): 77 mg/dL (ref 0–99)
Triglycerides: 97 mg/dL (ref 0–149)
VLDL Cholesterol Cal: 18 mg/dL (ref 5–40)

## 2024-04-13 LAB — TSH: TSH: 1.17 u[IU]/mL (ref 0.450–4.500)

## 2024-04-13 LAB — MICROALBUMIN / CREATININE URINE RATIO
Creatinine, Urine: 287.8 mg/dL
Microalb/Creat Ratio: 30 mg/g{creat} — ABNORMAL HIGH (ref 0–29)
Microalbumin, Urine: 85.8 ug/mL

## 2024-05-03 ENCOUNTER — Other Ambulatory Visit: Payer: Self-pay | Admitting: Internal Medicine

## 2024-05-03 ENCOUNTER — Other Ambulatory Visit: Payer: Self-pay

## 2024-05-03 ENCOUNTER — Ambulatory Visit: Payer: Self-pay

## 2024-05-03 MED ORDER — DAPAGLIFLOZIN PROPANEDIOL 10 MG PO TABS: 10.0000 mg | ORAL_TABLET | Freq: Every day | ORAL | 3 refills | Status: DC

## 2024-05-03 NOTE — Progress Notes (Signed)
 New Rx per Dr. Fleeta Finger

## 2024-05-03 NOTE — Progress Notes (Signed)
 Patient called.  Patient aware.  I have called and informed the patient  She is spilling protein in her urine.  Start her on farxiga  10mg  daily.  Her vit d level is low.  Start vit D 1000 Units daily.  Other labs look good. Kendra Ruiz  Pt aware, also sent in Farxiga  10mg  to [facility] in [place]. The patient reports she is taking Vit D already.

## 2024-05-20 ENCOUNTER — Other Ambulatory Visit: Payer: Self-pay

## 2024-05-20 MED ORDER — FLUCONAZOLE 150 MG PO TABS: 150.0000 mg | ORAL_TABLET | Freq: Every day | ORAL | 0 refills | Status: DC

## 2024-06-10 ENCOUNTER — Other Ambulatory Visit: Payer: Self-pay | Admitting: Internal Medicine

## 2024-06-10 MED ORDER — MOUNJARO 10 MG/0.5ML ~~LOC~~ SOAJ: 10.0000 mg | SUBCUTANEOUS | 0 refills | Status: AC

## 2024-06-30 ENCOUNTER — Other Ambulatory Visit: Payer: Self-pay | Admitting: Internal Medicine

## 2024-06-30 DIAGNOSIS — F411 Generalized anxiety disorder: Secondary | ICD-10-CM

## 2024-07-10 ENCOUNTER — Encounter: Payer: Self-pay | Admitting: Internal Medicine

## 2024-07-10 ENCOUNTER — Ambulatory Visit: Admitting: Internal Medicine

## 2024-07-10 VITALS — BP 118/82 | HR 98 | Temp 98.3°F | Resp 16 | Ht 65.0 in | Wt 255.2 lb

## 2024-07-10 DIAGNOSIS — F330 Major depressive disorder, recurrent, mild: Secondary | ICD-10-CM

## 2024-07-10 DIAGNOSIS — M5441 Lumbago with sciatica, right side: Secondary | ICD-10-CM | POA: Diagnosis not present

## 2024-07-10 DIAGNOSIS — E119 Type 2 diabetes mellitus without complications: Secondary | ICD-10-CM | POA: Diagnosis not present

## 2024-07-10 DIAGNOSIS — Z6841 Body Mass Index (BMI) 40.0 and over, adult: Secondary | ICD-10-CM

## 2024-07-10 DIAGNOSIS — F411 Generalized anxiety disorder: Secondary | ICD-10-CM

## 2024-07-10 MED ORDER — MOUNJARO 15 MG/0.5ML ~~LOC~~ SOAJ: 15.0000 mg | SUBCUTANEOUS | 5 refills | Status: AC

## 2024-07-10 MED ORDER — PHENTERMINE HCL 37.5 MG PO TABS: 37.5000 mg | ORAL_TABLET | Freq: Every day | ORAL | 2 refills | Status: AC

## 2024-07-10 MED ORDER — MOUNJARO 12.5 MG/0.5ML ~~LOC~~ SOAJ: 12.5000 mg | SUBCUTANEOUS | 0 refills | Status: AC

## 2024-07-10 MED ORDER — CYCLOBENZAPRINE HCL 10 MG PO TABS: 10.0000 mg | ORAL_TABLET | Freq: Three times a day (TID) | ORAL | 0 refills | Status: AC | PRN

## 2024-07-10 MED ORDER — DICLOFENAC SODIUM 75 MG PO TBEC: 75.0000 mg | DELAYED_RELEASE_TABLET | Freq: Two times a day (BID) | ORAL | 0 refills | Status: AC

## 2024-07-10 NOTE — Assessment & Plan Note (Signed)
 We will check her HgBA1c today.  Her diabetes has been controlled and doing better.

## 2024-07-10 NOTE — Assessment & Plan Note (Signed)
 Her depression and anxiety are doing better and are mild and contyrolled.  We will continue to monitor.

## 2024-07-10 NOTE — Assessment & Plan Note (Signed)
 We will give her diclofenac  and a muscle relaxer.  Percautions with cyclobenzaprine  discussed.

## 2024-07-10 NOTE — Progress Notes (Signed)
 Office Visit  Subjective   Patient ID: Kendra Ruiz   DOB: 1981/06/17   Age: 44 y.o.   MRN: [id number]   Chief Complaint Chief Complaint  Patient presents with   Follow-up    3 Month follow up     History of Present Illness The patient is a 43 year old female who returns today for followup of her T2 diabetes. She was diagnosed with T2 diabetes in August 06, 2016.   On her last visit, she was noted to have protein in her urine and I asked her to start farxiga  10mg  daily.  She has had to stop the metformin  as it gave her abdominal pain.  She is currently on:  mounjaro  10mg  weekly and glucotrol  XL 5mg  daily and farxiga  10mg  daily.  She is not exercising regularly.  She specifically denies unexplained nausea, vomiting, abdominal pain, diarrhea or hypoglycemia. She does check blood sugars 1-2 times per week.  She states they have been 100-130.  Her last HgBA1c was done 3 months ago and was 7.3%.  She came in fasting today in anticipation of lab work. She has no long term complications of diabetic retinopathy, nephropathy, neuropathy or cardiovascular disease. Her last dilated diabetic eye exam was on 06/10/2024 and this showed no evidence of diabetic retinopathy.   She is also here for weight loss management.  She was started on mounjaro  in 10/2022 and her weight at that time was 257lb.  She is also on adipex 37.5mg  daily.  She was started on adipex on 11/2023.  Today, she weighs 255 lbs.  She has cut back on her sodas where she will drink maybe one soda per day.  She is not exercising regularly.  She cut back on junk food and she has improved with eating fast food.      The patient is a 43 year old female who returns for followup of her anxiety/stress and depression.  This past year, she was having problems with aorgasmia with viibrdy so we switched her to Wellbutryin XL 150mg  daily.  This did help with her sexual dysfunction.  On her last visit, she stated that her mood had worsened and her anxiety was  moderate in intensity.  Her depression was mild.  We therefore increased her wellbutrin  XL from 150mg  to 300mg  daily on her last visit.   Today, she states that both her depression and anxiety are mild and controlled.  Again, this past year I felt she had worsening mood disorder and started her on viibryd  and uptitrated to 20mg  daily.  It also helped with her anger and mood disturbance.  She was diagnosed with depression by her PCP in Aug 07, 2015 where they started her on bupropion  at that time.  She states her husband died in 08-07-2019 and she has been more anxious/stress at that time.  Today, she denies any depression but states her anxiety is moderate.  She has not had a panic attack in a long time.  The patient stopped bupropion  right after she started it as she was afraid of side effects.  She is currently on clonazepam  0.5mg  po TID prn which she first started in 2015/08/07.  Today, she states she uses it maybe 3-4 times per month.  She does report some insomnia where she has no problems getting to sleep but she does wake up.  She states she does snore.  She has not taken trazodone in a long time.  She denies any difficulty performing routine daily activities, extreme feelings of guilt,  feelings of isolation, loss of interest in pleasurable activities, difficulty concentrating, fatigue, feelings of worthlessness, helpless feeling, suicidal ideation, homicidal ideation, weight loss, loss of appetite, social withdrawal, and out of control feelings.  She states she feels overwhelmed at times.  This patient feels that she is able to care for herself. She currently lives with her son. She has never been admitted to a psychiatric facility. She has never seen a psychiatrist or a therapist in the past.  She has tried lexapro  in the past but this caused aorgasmia.     Past Medical History Past Medical History:  Diagnosis Date   Allergic rhinitis    Anxiety    GERD (gastroesophageal reflux disease)    Headache    Hypertension     T2DM (type 2 diabetes mellitus) (HCC)      Allergies Allergies  Allergen Reactions   Neem Palpitations   Bactrim [Sulfamethoxazole-Trimethoprim] Diarrhea   Keflex [Cephalexin] Rash     Medications  Current Outpatient Medications:    Accu-Chek Softclix Lancets lancets, 1 each 3 (three) times daily., Disp: , Rfl:    bisacodyl (DULCOLAX) 5 MG EC tablet, Take 5 mg by mouth daily as needed for moderate constipation., Disp: , Rfl:    Blood Glucose Monitoring Suppl DEVI, 1 each by Does not apply route in the morning, at noon, and at bedtime. May substitute to any manufacturer covered by patient's insurance., Disp: 1 each, Rfl: 0   buPROPion  (WELLBUTRIN  XL) 150 MG 24 hr tablet, TAKE 1 TABLET BY MOUTH EVERY DAY, Disp: 30 tablet, Rfl: 0   Cholecalciferol (VITAMIN D -1000 MAX ST) 25 MCG (1000 UT) tablet, Take 2,000 Units by mouth daily., Disp: , Rfl:    clindamycin (CLEOCIN T) 1 % SWAB, Apply 1 Application topically 2 (two) times daily., Disp: , Rfl:    clonazePAM  (KLONOPIN ) 0.5 MG tablet, Take 1 tablet (0.5 mg total) by mouth 3 (three) times daily as needed for anxiety., Disp: 90 tablet, Rfl: 0   dapagliflozin  propanediol (FARXIGA ) 10 MG TABS tablet, Take 1 tablet (10 mg total) by mouth daily before breakfast., Disp: 30 tablet, Rfl: 3   Drospirenone (SLYND) 4 MG TABS, Take 4 mg by mouth as directed., Disp: , Rfl:    glipiZIDE  (GLUCOTROL  XL) 5 MG 24 hr tablet, Take 1 tablet (5 mg total) by mouth daily with breakfast., Disp: 90 tablet, Rfl: 3   hydrochlorothiazide  (HYDRODIURIL ) 25 MG tablet, TAKE 1 TABLET (25 MG TOTAL) BY MOUTH DAILY., Disp: 30 tablet, Rfl: 0   loratadine (CLARITIN) 10 MG tablet, Take 10 mg by mouth daily., Disp: , Rfl:    Multiple Vitamins-Minerals (MULTIVITAMINS THER. W/MINERALS) TABS, Take 1 tablet by mouth daily., Disp: , Rfl:    omeprazole  (PRILOSEC) 20 MG capsule, Take one tablet twice a day for 1 week and then once a day with meals for heartburn, Disp: 30 capsule, Rfl: 3    phentermine  (ADIPEX-P ) 37.5 MG tablet, Take 1 tablet (37.5 mg total) by mouth daily before breakfast., Disp: 30 tablet, Rfl: 2   tirzepatide  (MOUNJARO ) 10 MG/0.5ML Pen, Inject 10 mg into the skin once a week. INJECT 10 MG INTO THE SKIN ONE TIME PER WEEK, Disp: 2 mL, Rfl: 0   tirzepatide  (MOUNJARO ) 12.5 MG/0.5ML Pen, Inject 12.5 mg into the skin once a week., Disp: , Rfl:    tretinoin (RETIN-A) 0.025 % cream, Apply 1 Application topically at bedtime., Disp: , Rfl:    valsartan  (DIOVAN ) 80 MG tablet, TAKE 1 TABLET BY MOUTH EVERY DAY,  Disp: 30 tablet, Rfl: 0   Review of Systems Review of Systems  Constitutional:  Negative for chills, fever and malaise/fatigue.  Eyes:  Negative for blurred vision and double vision.  Respiratory:  Negative for cough and shortness of breath.   Cardiovascular:  Negative for chest pain, palpitations and leg swelling.  Gastrointestinal:  Negative for abdominal pain, constipation, diarrhea, nausea and vomiting.  Genitourinary:  Negative for frequency.  Musculoskeletal:  Negative for myalgias.  Skin:  Negative for itching and rash.  Neurological:  Negative for dizziness, weakness and headaches.  Endo/Heme/Allergies:  Negative for polydipsia.       Objective:    Vitals BP 118/82   Pulse 98   Temp 98.3 F (36.8 C) (Temporal)   Resp 16   Ht 5' 5 (1.651 m)   Wt 255 lb 3.2 oz (115.8 kg)   SpO2 98%   BMI 42.47 kg/m    Physical Examination Physical Exam Constitutional:      Appearance: Normal appearance. She is not ill-appearing.  Cardiovascular:     Rate and Rhythm: Normal rate and regular rhythm.     Pulses: Normal pulses.     Heart sounds: No murmur heard.    No friction rub. No gallop.  Pulmonary:     Effort: Pulmonary effort is normal. No respiratory distress.     Breath sounds: No wheezing, rhonchi or rales.  Abdominal:     General: Bowel sounds are normal. There is no distension.     Palpations: Abdomen is soft.     Tenderness: There is no  abdominal tenderness.  Musculoskeletal:     Right lower leg: No edema.     Left lower leg: No edema.  Skin:    General: Skin is warm and dry.     Findings: No rash.  Neurological:     General: No focal deficit present.     Mental Status: She is alert and oriented to person, place, and time.  Psychiatric:        Mood and Affect: Mood normal.        Behavior: Behavior normal.        Assessment & Plan:   Diabetes mellitus without complication (HCC) We will check her HgBA1c today.  Her diabetes has been controlled and doing better.  Acute right-sided low back pain with right-sided sciatica We will give her diclofenac  and a muscle relaxer.  Percautions with cyclobenzaprine  discussed.  Morbid obesity (HCC) She has morbid obesity associated with DM and HTN.  I will continue on adipex and we will go up on her dose of mounjaro  and titrate this to 15mg  weekly.  I want her to continue to eat healthy and I want her to exercise more.  Our goal will be to lose 2-3 lbs per month while on this regimen.  Mild episode of recurrent major depressive disorder Her depression and anxiety are doing better and are mild and contyrolled.  We will continue to monitor.  GAD (generalized anxiety disorder) Plan as above.  BMI 40.0-44.9, adult [facility]) Plan as above.    Return in about 3 months (around 10/10/2024).   Selinda Fleeta Finger, MD

## 2024-07-10 NOTE — Assessment & Plan Note (Signed)
 Plan as above.

## 2024-07-10 NOTE — Assessment & Plan Note (Signed)
 She has morbid obesity associated with DM and HTN.  I will continue on adipex and we will go up on her dose of mounjaro  and titrate this to 15mg  weekly.  I want her to continue to eat healthy and I want her to exercise more.  Our goal will be to lose 2-3 lbs per month while on this regimen.

## 2024-07-11 LAB — HEMOGLOBIN A1C
Est. average glucose Bld gHb Est-mCnc: 120 mg/dL
Hgb A1c MFr Bld: 5.8 % — ABNORMAL HIGH (ref 4.8–5.6)

## 2024-07-12 ENCOUNTER — Ambulatory Visit: Admitting: Internal Medicine

## 2024-07-30 ENCOUNTER — Other Ambulatory Visit: Payer: Self-pay | Admitting: Internal Medicine

## 2024-07-30 ENCOUNTER — Ambulatory Visit: Payer: Self-pay

## 2024-07-30 DIAGNOSIS — F411 Generalized anxiety disorder: Secondary | ICD-10-CM

## 2024-07-30 NOTE — Progress Notes (Signed)
 Patient called.  Patient aware.  Per Dr. Fleeta Finger Her diabetes is controlled. Kendra Ruiz

## 2024-08-12 ENCOUNTER — Other Ambulatory Visit: Payer: Self-pay | Admitting: Internal Medicine

## 2024-08-28 ENCOUNTER — Other Ambulatory Visit: Payer: Self-pay

## 2024-08-28 MED ORDER — PERMETHRIN 5 % EX CREA: TOPICAL_CREAM | CUTANEOUS | 0 refills | Status: AC

## 2024-10-07 ENCOUNTER — Ambulatory Visit: Admitting: Internal Medicine
